# Patient Record
Sex: Male | Born: 1940 | Hispanic: Yes | Marital: Married | Smoking: Never smoker
Health system: Southern US, Community
[De-identification: ages and names within clinical notes are randomized; demographics above are authoritative.]

## PROBLEM LIST (undated history)

## (undated) HISTORY — PX: HERNIA REPAIR: SHX51

## (undated) HISTORY — PX: BREAST LUMPECTOMY: SHX2

---

## 2011-12-24 ENCOUNTER — Ambulatory Visit: Payer: Self-pay | Admitting: Sports Medicine

## 2012-01-25 ENCOUNTER — Ambulatory Visit: Payer: Self-pay | Admitting: Unknown Physician Specialty

## 2013-12-18 ENCOUNTER — Inpatient Hospital Stay: Payer: Self-pay

## 2013-12-18 LAB — CBC WITH DIFFERENTIAL/PLATELET
BASOS PCT: 1 %
Basophil #: 0.1 10*3/uL (ref 0.0–0.1)
Eosinophil #: 0.1 10*3/uL (ref 0.0–0.7)
Eosinophil %: 2.2 %
HCT: 39.1 % — ABNORMAL LOW (ref 40.0–52.0)
HGB: 13.2 g/dL (ref 13.0–18.0)
LYMPHS PCT: 31 %
Lymphocyte #: 2 10*3/uL (ref 1.0–3.6)
MCH: 32.1 pg (ref 26.0–34.0)
MCHC: 33.7 g/dL (ref 32.0–36.0)
MCV: 95 fL (ref 80–100)
Monocyte #: 0.5 x10 3/mm (ref 0.2–1.0)
Monocyte %: 8.1 %
NEUTROS ABS: 3.7 10*3/uL (ref 1.4–6.5)
Neutrophil %: 57.7 %
Platelet: 186 10*3/uL (ref 150–440)
RBC: 4.1 10*6/uL — ABNORMAL LOW (ref 4.40–5.90)
RDW: 14.1 % (ref 11.5–14.5)
WBC: 6.4 10*3/uL (ref 3.8–10.6)

## 2013-12-18 LAB — COMPREHENSIVE METABOLIC PANEL
AST: 23 U/L (ref 15–37)
Albumin: 4 g/dL (ref 3.4–5.0)
Alkaline Phosphatase: 54 U/L
Anion Gap: 4 — ABNORMAL LOW (ref 7–16)
BUN: 17 mg/dL (ref 7–18)
Bilirubin,Total: 0.5 mg/dL (ref 0.2–1.0)
CREATININE: 0.72 mg/dL (ref 0.60–1.30)
Calcium, Total: 8.8 mg/dL (ref 8.5–10.1)
Chloride: 105 mmol/L (ref 98–107)
Co2: 28 mmol/L (ref 21–32)
EGFR (African American): >60
EGFR (Non-African Amer.): >60
Glucose: 98 mg/dL (ref 65–99)
Osmolality: 275 (ref 275–301)
POTASSIUM: 3.8 mmol/L (ref 3.5–5.1)
SGPT (ALT): 24 U/L (ref 12–78)
SODIUM: 137 mmol/L (ref 136–145)
Total Protein: 7 g/dL (ref 6.4–8.2)

## 2013-12-18 LAB — URINALYSIS, COMPLETE
BILIRUBIN, UR: NEGATIVE
BLOOD: NEGATIVE
Bacteria: NONE SEEN
Glucose,UR: NEGATIVE mg/dL (ref 0–75)
Leukocyte Esterase: NEGATIVE
NITRITE: NEGATIVE
Ph: 5 (ref 4.5–8.0)
Protein: NEGATIVE
RBC,UR: 1 /HPF (ref 0–5)
Specific Gravity: 1.023 (ref 1.003–1.030)
Squamous Epithelial: <1
WBC UR: 1 /HPF (ref 0–5)

## 2013-12-18 LAB — PROTIME-INR
INR: 1
Prothrombin Time: 12.6 secs (ref 11.5–14.7)

## 2013-12-18 LAB — TSH: Thyroid Stimulating Horm: 4.7 u[IU]/mL — ABNORMAL HIGH

## 2013-12-19 LAB — BASIC METABOLIC PANEL
ANION GAP: 5 — AB (ref 7–16)
BUN: 12 mg/dL (ref 7–18)
Calcium, Total: 8.7 mg/dL (ref 8.5–10.1)
Chloride: 107 mmol/L (ref 98–107)
Co2: 27 mmol/L (ref 21–32)
Creatinine: 0.78 mg/dL (ref 0.60–1.30)
EGFR (Non-African Amer.): >60
Glucose: 128 mg/dL — ABNORMAL HIGH (ref 65–99)
Osmolality: 279 (ref 275–301)
POTASSIUM: 3.7 mmol/L (ref 3.5–5.1)
Sodium: 139 mmol/L (ref 136–145)

## 2013-12-19 LAB — LIPID PANEL
CHOLESTEROL: 152 mg/dL (ref 0–200)
HDL Cholesterol: 68 mg/dL — ABNORMAL HIGH (ref 40–60)
Ldl Cholesterol, Calc: 68 mg/dL (ref 0–100)
TRIGLYCERIDES: 79 mg/dL (ref 0–200)
VLDL CHOLESTEROL, CALC: 16 mg/dL (ref 5–40)

## 2013-12-20 LAB — URINE CULTURE

## 2014-05-24 ENCOUNTER — Inpatient Hospital Stay: Payer: Self-pay | Admitting: Internal Medicine

## 2014-05-24 LAB — CBC
HCT: 31.1 % — ABNORMAL LOW (ref 40.0–52.0)
HGB: 10.3 g/dL — ABNORMAL LOW (ref 13.0–18.0)
MCH: 32 pg (ref 26.0–34.0)
MCHC: 33 g/dL (ref 32.0–36.0)
MCV: 97 fL (ref 80–100)
Platelet: 198 10*3/uL (ref 150–440)
RBC: 3.21 10*6/uL — ABNORMAL LOW (ref 4.40–5.90)
RDW: 14.5 % (ref 11.5–14.5)
WBC: 9 10*3/uL (ref 3.8–10.6)

## 2014-05-24 LAB — COMPREHENSIVE METABOLIC PANEL
Albumin: 3.1 g/dL — ABNORMAL LOW (ref 3.4–5.0)
Alkaline Phosphatase: 39 U/L — ABNORMAL LOW
Anion Gap: 10 (ref 7–16)
BUN: 26 mg/dL — ABNORMAL HIGH (ref 7–18)
Bilirubin,Total: 0.4 mg/dL (ref 0.2–1.0)
CO2: 24 mmol/L (ref 21–32)
CREATININE: 0.96 mg/dL (ref 0.60–1.30)
Calcium, Total: 8.2 mg/dL — ABNORMAL LOW (ref 8.5–10.1)
Chloride: 105 mmol/L (ref 98–107)
EGFR (African American): >60
GLUCOSE: 246 mg/dL — AB (ref 65–99)
OSMOLALITY: 290 (ref 275–301)
Potassium: 4.1 mmol/L (ref 3.5–5.1)
SGOT(AST): 21 U/L (ref 15–37)
SGPT (ALT): 25 U/L
SODIUM: 139 mmol/L (ref 136–145)
Total Protein: 5.8 g/dL — ABNORMAL LOW (ref 6.4–8.2)

## 2014-05-24 LAB — APTT

## 2014-05-24 LAB — PROTIME-INR
INR: 1.1
Prothrombin Time: 13.9 secs (ref 11.5–14.7)

## 2014-05-24 LAB — HEMATOCRIT: HCT: 27.6 % — ABNORMAL LOW (ref 40.0–52.0)

## 2014-05-24 LAB — HEMOGLOBIN: HGB: 9 g/dL — ABNORMAL LOW (ref 13.0–18.0)

## 2014-05-24 LAB — LIPASE, BLOOD: LIPASE: 139 U/L (ref 73–393)

## 2014-05-25 LAB — BASIC METABOLIC PANEL
Anion Gap: 11 (ref 7–16)
BUN: 14 mg/dL (ref 7–18)
Calcium, Total: 7.5 mg/dL — ABNORMAL LOW (ref 8.5–10.1)
Chloride: 111 mmol/L — ABNORMAL HIGH (ref 98–107)
Co2: 23 mmol/L (ref 21–32)
Creatinine: 0.61 mg/dL (ref 0.60–1.30)
EGFR (African American): >60
EGFR (Non-African Amer.): >60
GLUCOSE: 79 mg/dL (ref 65–99)
Osmolality: 288 (ref 275–301)
Potassium: 3.9 mmol/L (ref 3.5–5.1)
SODIUM: 145 mmol/L (ref 136–145)

## 2014-05-25 LAB — CBC WITH DIFFERENTIAL/PLATELET
BASOS ABS: 0.1 10*3/uL (ref 0.0–0.1)
Basophil %: 2.2 %
Eosinophil #: 0.2 10*3/uL (ref 0.0–0.7)
Eosinophil %: 3.1 %
HCT: 24.8 % — ABNORMAL LOW (ref 40.0–52.0)
HGB: 8.2 g/dL — ABNORMAL LOW (ref 13.0–18.0)
Lymphocyte #: 1.2 10*3/uL (ref 1.0–3.6)
Lymphocyte %: 21 %
MCH: 31.7 pg (ref 26.0–34.0)
MCHC: 33 g/dL (ref 32.0–36.0)
MCV: 96 fL (ref 80–100)
Monocyte #: 0.4 x10 3/mm (ref 0.2–1.0)
Monocyte %: 7.8 %
NEUTROS ABS: 3.6 10*3/uL (ref 1.4–6.5)
NEUTROS PCT: 65.9 %
PLATELETS: 162 10*3/uL (ref 150–440)
RBC: 2.58 10*6/uL — AB (ref 4.40–5.90)
RDW: 14.3 % (ref 11.5–14.5)
WBC: 5.5 10*3/uL (ref 3.8–10.6)

## 2014-05-25 LAB — HEMOGLOBIN A1C: Hemoglobin A1C: 6.4 % — ABNORMAL HIGH (ref 4.2–6.3)

## 2014-05-26 LAB — CBC WITH DIFFERENTIAL/PLATELET
BASOS PCT: 1.1 %
Basophil #: 0.1 10*3/uL (ref 0.0–0.1)
EOS PCT: 2.6 %
Eosinophil #: 0.2 10*3/uL (ref 0.0–0.7)
HCT: 29.7 % — AB (ref 40.0–52.0)
HGB: 9.9 g/dL — ABNORMAL LOW (ref 13.0–18.0)
Lymphocyte #: 1.3 10*3/uL (ref 1.0–3.6)
Lymphocyte %: 21.1 %
MCH: 31.8 pg (ref 26.0–34.0)
MCHC: 33.5 g/dL (ref 32.0–36.0)
MCV: 95 fL (ref 80–100)
Monocyte #: 0.7 x10 3/mm (ref 0.2–1.0)
Monocyte %: 11.2 %
Neutrophil #: 4.1 10*3/uL (ref 1.4–6.5)
Neutrophil %: 64 %
PLATELETS: 165 10*3/uL (ref 150–440)
RBC: 3.12 10*6/uL — ABNORMAL LOW (ref 4.40–5.90)
RDW: 14.1 % (ref 11.5–14.5)
WBC: 6.3 10*3/uL (ref 3.8–10.6)

## 2014-05-26 LAB — BASIC METABOLIC PANEL
Anion Gap: 7 (ref 7–16)
BUN: 5 mg/dL — ABNORMAL LOW (ref 7–18)
CHLORIDE: 109 mmol/L — AB (ref 98–107)
CO2: 26 mmol/L (ref 21–32)
Calcium, Total: 7.9 mg/dL — ABNORMAL LOW (ref 8.5–10.1)
Creatinine: 0.63 mg/dL (ref 0.60–1.30)
EGFR (African American): >60
EGFR (Non-African Amer.): >60
Glucose: 107 mg/dL — ABNORMAL HIGH (ref 65–99)
Osmolality: 281 (ref 275–301)
POTASSIUM: 3.8 mmol/L (ref 3.5–5.1)
Sodium: 142 mmol/L (ref 136–145)

## 2014-05-26 LAB — OCCULT BLOOD X 1 CARD TO LAB, STOOL: OCCULT BLOOD, FECES: POSITIVE

## 2015-01-26 NOTE — Consult Note (Signed)
Pt seen and examined. Please see Dawn Harrison's notes. No further bleeding since 830 this AM. Has hx of diverticulosis and colon polyps. Last colon done in 2011. Likely diverticular bleed, exacerbated by ASA/plavix. Stable. Clear liquid diet ordered. Will check for rebleeding as diet is advanced. Not due to repeat colon until 2016. However, if there is rebleeding, will consider sooner. Bleeding scan only if active bleeding. Thanks.  Electronic Signatures: Verdie Shire (MD)  (Signed on 20-Aug-15 15:47)  Authored  Last Updated: 20-Aug-15 15:47 by Verdie Shire (MD)

## 2015-01-26 NOTE — H&P (Signed)
PATIENT NAME:  Russell Holmes, Russell Holmes MR#:  [id number] DATE OF BIRTH:  23-May-1941  DATE OF ADMISSION:  05/24/2014  PRIMARY CARE PHYSICIAN: Adrian Prows, MD  REFERRING EMERGENCY ROOM PHYSICIAN: Dr. Jacqualine Code  CHIEF COMPLAINT: Blood in stool.  HISTORY OF PRESENT ILLNESS: The patient is a 74 year old male with past medical history of hypertension, recent history of stroke on aspirin and Plavix, diabetes mellitus and hyperlipidemia who is presenting to the ED with the chief complaint of 2 day history of left lower quadrant abdominal pain, which is resolved now, bright red blood mixed with stool x1 yesterday, black tarry stool today morning with no nausea and vomiting. The patient reports no similar complaints in the past. He had a colonoscopy done in the year 2011 and got 2 polyps removed. The patient does not know whether the polyps were benign or malignant. The patient has recent history of stroke approximately 6 weeks ago regarding which he was placed on aspirin and Plavix. In the ED, the patient was made n.p.o. Typing and screening of the blood work done. However, the patient's hemoglobin is stable at 10.3 with hematocrit of 31.1. The ED physician has given him IV fluids and 1 dose of proton pump inhibitor was ordered. He has discussed with the on-call gastroenterologist, Dr. Candace Cruise, who has recommended to admit the patient so that he can see him as soon as possible. During my examination, the patient is resting comfortably. His sister is at bedside.   PAST MEDICAL HISTORY: Recent history of stroke, on aspirin and Plavix, hyperlipidemia, diabetes mellitus, hypertension.   PAST SURGICAL HISTORY: Colonoscopy and polypectomy.   ALLERGIES: No known drug allergies.   PSYCHOSOCIAL HISTORY: Lives at home with wife. Denies any history of smoking. Drinks liquor 2 to 3 drinks two times per week. Denies any illicit drugs.   FAMILY HISTORY: Dad deceased at a young age. Mom has history of COPD.   HOME MEDICATIONS:  Travatan 0.004% one drop to each effected eye once daily, Timolol 0.5% one drop each effected eye 2 times a day, quinapril 40 mg 1 tablet p.o. once daily, metformin 500 mg 2 times a day, lovastatin 40 mg p.o. once daily, Plavix 75 mg once daily, Claritin 10 mg once daily, amlodipine 5 mg once daily.   REVIEW OF SYSTEMS: CONSTITUTIONAL: Denies any fever, fatigue, weakness, weight loss or weight gain.  EYES: Denies blurry vision, double vision, glaucoma.  ENT: Denies epistaxis, discharge, hearing loss.  RESPIRATORY:  Denies cough, COPD or hemoptysis.  CARDIOVASCULAR: No chest pain, palpitations, syncope.  GASTROINTESTINAL: Denies nausea, vomiting. Complaining of diarrhea. Had left upper quadrant abdominal pain for 2 days, which is resolved at this time. Today he has melena. Yesterday he had rectal bleeding with fresh blood mixed with stool. Denies any history of hemorrhoids. GENITOURINARY: No dysuria, hematuria, renal calculi.  ENDOCRINE: Denies polyuria, nocturia. Has history of diabetes mellitus. HEMATOLOGIC AND LYMPHATIC: No anemia, easy bruising, bleeding.  INTEGUMENTARY: No acne, rash, lesions.  MUSCULOSKELETAL: No joint pain in the neck and back. Denies gout. No arthritis.  NEUROLOGIC: Denies vertigo, ataxia. Has recent history of stroke. No residual deficits.  PSYCHIATRIC: Denies any anxiety, insomnia, ADD or OCD.  PHYSICAL EXAMINATION: VITAL SIGNS: Temperature afebrile, pulse 88, respirations 18, blood pressure 117/73, pulse ox 100%.  GENERAL APPEARANCE: Not in acute distress. Moderately built and moderately nourished, thin-looking, not emaciated. HEENT: Normocephalic, atraumatic. Pupils are equally reacting to light and accommodation. No scleral icterus. No conjunctival injection. No sinus tenderness. No postnasal drip. Moist mucous membranes.  NECK: Supple. No JVD. No thyromegaly. Range of motion is intact.  LUNGS: Clear to auscultation bilaterally. No accessory muscle usage. No  anterior chest wall tenderness on palpation.  CARDIAC: S1, S2 normal. Regular rate and rhythm. No murmurs.  ABDOMEN: Soft. Bowel sounds are positive in all 4 quadrants. Nontender, nondistended. No hepatosplenomegaly. No masses felt.  NEUROLOGIC: Awake, alert and oriented x3. Cranial nerves II through XII are grossly intact. Motor and sensory are intact. Reflexes are 2+.  EXTREMITIES: No edema. No cyanosis. No clubbing.  SKIN: Warm to touch. Normal turgor. No rashes. No lesions.  MUSCULOSKELETAL: No joint effusion, tenderness, erythema.  PSYCHIATRIC: Normal mood and affect.  DIAGNOSTIC DATA: WBC 9, hemoglobin 10.3, hematocrit 31.1, platelets 198,000, MCV 97. PT 13.9. INR 1.1 BMP: BUN at 26, glucose at 246. The rest of the BMP is normal. Calcium at 8.2. Lipase 139.   ASSESSMENT AND PLAN: A 74 year old Hispanic male who speaks sufficient English who is presenting to the ED with the chief complaint of left lower quadrant abdominal pain for 2 days which is currently resolved, currently presenting with fresh blood mixed in his stool yesterday and black stool today morning.  1.  Gastrointestinal bleed, both upper and lower. We will keep him n.p.o., admit him to medical floor, keep him n.p.o. Hemoglobin and hematocrit are stable at this time. Provide IV fluids. Provide Protonix 40 mg IV q. 12 hours. Gastroenterology consult is placed to Dr. Candace Cruise and notified by the ED physician. He is aware. We will monitor hemoglobin and hematocrit q. 8 hours. Typing and screening is done. If necessary we will transfuse blood. The patient had a colonoscopy in the year 2011 and was diagnosed with polyps, which were removed. Pathology is unknown to the patient. We will assume benign polyps.  2.  Diabetes mellitus, not insulin-dependent. We will hold his home medication, metformin. Check hemoglobin A1c and cover his hyperglycemia with insulin sliding scale.  3.  Hypertension. Blood pressure is stable at this time. We will hold  home medications. The patient is n.p.o.  4.  Hyperlipidemia. The patient is currently n.p.o. We will resume his statin once the patient's diet is resumed.  5.  Recent history of stroke 4 to 6 weeks ago. We will hold aspirin and Plavix in view of gastrointestinal bleed.  6.  History of alcohol abuse. Counseled the patient to stop drinking.  7.  We will provide gastrointestinal prophylaxis with Protonix IV 2 times a day and deep vein thrombosis prophylaxis with SCDs.   Plan of care was discussed in detail with the patient. He verbalized understanding of the plan. He is FULL code. Wife is the medical power of attorney.   TOTAL TIME SPENT ON THE ADMISSION: 45 minutes.  ____________________________ Nicholes Mango, MD ag:sb D: 05/24/2014 11:21:02 ET T: 05/24/2014 14:52:11 ET JOB#: [id number]  cc: Nicholes Mango, MD, <Dictator> Cheral Marker. Ola Spurr, MD Lupita Dawn. Candace Cruise, MD Nicholes Mango MD ELECTRONICALLY SIGNED 05/24/2014 16:27

## 2015-01-26 NOTE — Consult Note (Signed)
Brief Consult Note: Diagnosis: Lower GI bleed.  Probable diverticular in nature.  History of diverticulosis according to patient as well as history of colonic polyps.  Newly diagnosed with CVA three months ago.  Chronic antiplatelet therapy.  Diabetes mellitus.   Consult note dictated.   Discussed with Attending MD.   Comments: Patient's presentation discussed with Dr. Verdie Shire.  Will proceed with advancing diet from NPO to clear liquid diet.  Continue to monitor for evidence of reoccurrence of bleeding.  If severe may benefit from proceeding with GI blood loss study.  Recommend serial monitoring of hemoglobin.  Transfuse as necessary.  Do recommend continued GI evaluation on outpatient basis if clinically feasible, due to his history of colonic polyps..  Electronic Signatures: Payton Emerald (NP)  (Signed 20-Aug-15 14:37)  Authored: Brief Consult Note   Last Updated: 20-Aug-15 14:37 by Payton Emerald (NP)

## 2015-01-26 NOTE — Consult Note (Signed)
Referring Physician:  Angelena Form :   Primary Care Physician:  Angelena Form : [facility], Internal Medicine, [address] [place], [place] [address], [phone]  Reason for Consult: Admit Date: 18-Dec-2013  Chief Complaint: R sided weakness and slurred speech  Reason for Consult: CVA   History of Present Illness: History of Present Illness:   74 yo RHD M presents to [facility] after abrupt onset of slurred speech and R sided weakness when he awakened.  Pt was not a tPA candidate due to unknown onset time.  Pt has never had any weakness or numbness before or vision changes.  Pt denies difficulty swallowing.  ROS:  General denies complaints    HEENT no complaints    Lungs no complaints    Cardiac no complaints    GI no complaints    GU no complaints    Musculoskeletal no complaints    Extremities no complaints    Skin no complaints    Neuro slurred speech, R sided weakness    Endocrine no complaints    Psych no complaints    Past Medical/Surgical Hx:  Eye Surgery: Has had fluid removed bilat.  hypertension:   low back pain:   Arthritis:   Diabetes Mellitus, Type II (NIDD):   hernia repair:   Home Medications: Medication Instructions Last Modified Date/Time  quinapril 20 mg oral tablet 1 tab(s) orally once a day in am 16-Mar-15 19:16  lovastatin 10 mg oral tablet 1 tab(s) orally once a day in am 16-Mar-15 19:16  metformin 500 mg oral tablet 1 tab(s) orally 2 times a day 16-Mar-15 19:16  Travatan Z 0.004% ophthalmic solution 1 drop(s) to each affected eye once a day (in the evening) 16-Mar-15 19:16  Alphagan P 0.1% ophthalmic solution 1 drop(s) to each affected eye 2 times a day 16-Mar-15 19:16  Bayer Aspirin Regimen 81 mg oral delayed release tablet 1 tab(s) orally once a day 16-Mar-15 19:16  Motrin 200 mg oral tablet 1 tab(s) orally once a day 16-Mar-15 19:16   Allergies:  No Known Allergies:   Social/Family  History: Employment Status: retired  Lives With: spouse  Living Arrangements: house  Social History: no tob, no EtOH, no illicits  Family History: no stroke   Vital Signs: **Vital Signs.:   17-Mar-15 11:04  Vital Signs Type Q 4hr  Temperature Temperature (F) 98.2  Celsius 36.7  Temperature Source oral  Pulse Pulse 84  Respirations Respirations 20  Systolic BP Systolic BP 546  Diastolic BP (mmHg) Diastolic BP (mmHg) 77  Mean BP 97  Pulse Ox % Pulse Ox % 98  Pulse Ox Activity Level  At rest  Oxygen Delivery Room Air/ 21 %   Physical Exam: General: slightly overweight, NAD  HEENT: normocephalic, sclera nonicteric, oropharynx clear  Neck: supple, no JVD, no bruits  Chest: CTA B, no wheezing, good movement  Cardiac: RRR, no murmurs, no edema, 2+ pulses  Extremities: no C/C/E, FROM   Neurologic Exam: Mental Status: alert and oriented x 3, normal language, follows complex commands, moderate dysarthria  Cranial Nerves: PERRLA, EOMI, nl VF, mild R nasolabial flattening, tongue midline, shoulder shrug equal  Motor Exam: 4/5 R, 5-/5 L, nl tone  Deep Tendon Reflexes: 2+/4 B, plantars downgoing B, no Hoffman  Sensory Exam: pinprick, temperature, and vibration intact B  Coordination: FTN and HTS WNL except mild weakness   Lab Results:  Thyroid:  16-Mar-15 16:56   Thyroid Stimulating Hormone  4.70 (0.45-4.50 (International Unit)  -----------------------  Pregnant patients have  different reference  ranges for TSH:  - - - - - - - - - -  Pregnant, first trimetser:  0.36 - 2.50 uIU/mL)  LabObservation:  16-Mar-15 13:59   OBSERVATION Reason for Test  Hepatic:  16-Mar-15 16:56   Bilirubin, Total 0.5  Alkaline Phosphatase 54 (45-117 NOTE: New Reference Range 08/25/13)  SGPT (ALT) 24  SGOT (AST) 23  Total Protein, Serum 7.0  Albumin, Serum 4.0  Routine Micro:  16-Mar-15 13:38   Micro Text Report URINE CULTURE   COMMENT                   COLONIES TOO SMALL TO READ    ANTIBIOTIC                       Specimen Source CLEAN CATCH  Culture Comment COLONIES TOO SMALL TO READ  Result(s) reported on 19 Dec 2013 at 11:59AM.  Routine Chem:  16-Mar-15 13:45   Result Comment labs - verified in error, tests reordered  Result(s) reported on 18 Dec 2013 at 03:16PM.  17-Mar-15 05:21   Glucose, Serum  128  BUN 12  Creatinine (comp) 0.78  Sodium, Serum 139  Potassium, Serum 3.7  Chloride, Serum 107  CO2, Serum 27  Calcium (Total), Serum 8.7  Anion Gap  5  Osmolality (calc) 279  eGFR (African American) >60  eGFR (Non-African American) >60 (eGFR values <50mL/min/1.73 m2 may be an indication of chronic kidney disease (CKD). Calculated eGFR is useful in patients with stable renal function. The eGFR calculation will not be reliable in acutely ill patients when serum creatinine is changing rapidly. It is not useful in  patients on dialysis. The eGFR calculation may not be applicable to patients at the low and high extremes of body sizes, pregnant women, and vegetarians.)  Cholesterol, Serum 152  Triglycerides, Serum 79  HDL (INHOUSE)  68  VLDL Cholesterol Calculated 16  LDL Cholesterol Calculated 68 (Result(s) reported on 19 Dec 2013 at 06:23AM.)  Routine UA:  16-Mar-15 13:38   Color (UA) Yellow  Clarity (UA) Cloudy  Glucose (UA) Negative  Bilirubin (UA) Negative  Ketones (UA) Trace  Specific Gravity (UA) 1.023  Blood (UA) Negative  pH (UA) 5.0  Protein (UA) Negative  Nitrite (UA) Negative  Leukocyte Esterase (UA) Negative (Result(s) reported on 18 Dec 2013 at 03:42PM.)  RBC (UA) 1 /HPF  WBC (UA) 1 /HPF  Bacteria (UA) NONE SEEN  Epithelial Cells (UA) <1 /HPF  Mucous (UA) PRESENT  Calcium Oxalate Crystal (UA) PRESENT (Result(s) reported on 18 Dec 2013 at 03:42PM.)  Routine Coag:  16-Mar-15 16:56   Prothrombin 12.6  INR 1.0 (INR reference interval applies to patients on anticoagulant therapy. A single INR therapeutic range for coumarins is not  optimal for all indications; however, the suggested range for most indications is 2.0 - 3.0. Exceptions to the INR Reference Range may include: Prosthetic heart valves, acute myocardial infarction, prevention of myocardial infarction, and combinations of aspirin and anticoagulant. The need for a higher or lower target INR must be assessed individually. Reference: The Pharmacology and Management of the Vitamin K  antagonists: the seventh ACCP Conference on Antithrombotic and Thrombolytic Therapy. KGMWN.0272 Sept:126 (3suppl): [id number]. A HCT value >55% may artifactually increase the PT.  In one study,  the increase was an average of 25%. Reference:  "Effect on Routine and Special Coagulation Testing Values of Citrate Anticoagulant Adjustment in Patients with High HCT Values." American Journal of  Clinical Pathology 2006;126:400-405.)  Routine Hem:  16-Mar-15 13:45   Bands -  Segmented Neutrophils -  Lymphocytes -  Variant Lymphocytes -  Monocytes -  Eosinophil -  Basophil -  Metamyelocyte -  Myelocyte -  Promyelocyte -  Blast-Like -  Other Cells -  NRBC -  Diff Comment 1 -  Diff Comment 2 -  Diff Comment 3 -  Diff Comment 4 -  Diff Comment 5 -  Diff Comment 6 -  Diff Comment 7 -  Diff Comment 8 -  Diff Comment 9 -  Diff Comment 10 - (Result(s) reported on 18 Dec 2013 at 03:16PM.)    16:56   WBC (CBC) 6.4  RBC (CBC)  4.10  Hemoglobin (CBC) 13.2  Hematocrit (CBC)  39.1  Platelet Count (CBC) 186  MCV 95  MCH 32.1  MCHC 33.7  RDW 14.1  Neutrophil % 57.7  Lymphocyte % 31.0  Monocyte % 8.1  Eosinophil % 2.2  Basophil % 1.0  Neutrophil # 3.7  Lymphocyte # 2.0  Monocyte # 0.5  Eosinophil # 0.1  Basophil # 0.1 (Result(s) reported on 18 Dec 2013 at 05:38PM.)   Radiology Results: [place]:    16-Mar-15 16:09, US Carotid Doppler Bilateral  US Carotid Doppler Bilateral   REASON FOR EXAM:    cva  COMMENTS:       PROCEDURE: [place]  - US CAROTID DOPPLER BILATERAL  - Dec 18 2013  4:09PM     CLINICAL DATA:  CVA, hypertension, syncopal episode, hyperlipidemia,  diabetes    EXAM:  BILATERAL CAROTID DUPLEX ULTRASOUND    TECHNIQUE:  Pearline Cables scale imaging, color Doppler and duplex ultrasound were  performed of bilateral carotid and vertebral arteries in the neck.  COMPARISON:  None.    FINDINGS:  Criteria: Quantification of carotid stenosis is based on velocity  parameters that correlate the residual internal carotid diameter  with NASCET-based stenosis levels, using the diameter of the distal  internal carotid lumen as the denominator for stenosis measurement.    The following velocity measurements were obtained:    RIGHT    ICA:  75/20 cm/sec    CCA:  14/43 cm/sec  SYSTOLIC ICA/CCA RATIO:  0.8    DIASTOLIC ICA/CCA RATIO:  1.54    ECA:  131 cm/sec    LEFT    ICA:  111/21 cm/sec    CCA:  008/67 cm/sec    SYSTOLIC ICA/CCA RATIO:  6.19    DIASTOLIC ICA/CCA RATIO:  1.5  ECA: 137 cm/sec    RIGHT CAROTID ARTERY: There is a minimal amount of intimal wall  thickening and echogenic plaque within the right carotid bulb  (images 12 and 34). There is a minimal amount of eccentric echogenic  plaque involving the origin of the right internal carotid artery  (image 18), not resulting in elevated peak systolic velocities  within the interrogated course of the right internal carotid artery  to suggest a hemodynamically significant stenosis.    RIGHT VERTEBRAL ARTERY:  Antegrade flow    LEFT CAROTID ARTERY: There is a a minimal amount of eccentric mixed  echogenic plaque within the left carotid bulb (image 49). There is a  minimal amount of eccentric echogenic plaque within the mid aspect  of the left internal carotid artery (image 57), not resulting in  elevated peak systolic velocities with the enteric mid course of the  left internal carotid artery to suggest a hemodynamically  significant stenosis.    LEFT VERTEBRAL ARTERY:  Antegrade  flow      IMPRESSION:  Minimal amount of bilateral atherosclerotic plaque, left  subjectively greater to the right, not resulting in a  hemodynamically significant stenosis.      Electronically Signed    By: Sandi Mariscal M.D.    On: 12/18/2013 16:29         Verified By: Aileen Fass, M.D.,  MRI:    17-Mar-15 09:24, MRI Brain Without Contrast  MRI Brain Without Contrast   REASON FOR EXAM:    cva, R weakness, slurred speech  COMMENTS:       PROCEDURE: MR  - MR BRAIN WO CONTRAST  - Dec 19 2013  9:24AM     CLINICAL DATA:  Right-sided weakness and slurred speech. Evaluate  for stroke.    EXAM:  MRI HEAD WITHOUT CONTRAST    TECHNIQUE:  Multiplanar, multiecho pulse sequences of the brain and surrounding  structures were obtained without intravenous contrast.  COMPARISON:  Head CT 12/18/2013    FINDINGS:  There is an acute left basal ganglia infarct in the lateral  lenticulostriate territory. There is no intracranial hemorrhage.  Remote left cerebellar infarcts are noted. Small, scattered foci of  T2 hyperintensity are present in the subcortical and deep cerebral  white matter bilaterally, nonspecific but compatible with mild  chronic small vessel ischemic disease. There is mild to moderate  generalized cerebral atrophy. There is no evidence of mass, midline  shift, or extra-axial fluid collection.    Orbits are unremarkable. Mild mucosal thickening is noted in the  sphenoid sinuses, maxillary sinuses, and left ethmoid air cells.  Mastoid air cells are clear. Major intracranial vascular flow voids  are preserved. C3-4 spondylosis is noted.     IMPRESSION:  1. Acute left basal ganglia infarct.  2. Remote left cerebellar infarcts and mild chronic small vessel  ischemic disease.      Electronically Signed    By: Logan Bores    On: 12/19/2013 10:32         Verified By: Ferol Luz, M.D.,  CT:    16-Mar-15 13:57, CT Head Without Contrast  CT Head Without Contrast    REASON FOR EXAM:    weakness, dysarthrisa  COMMENTS:       PROCEDURE: CT  - CT HEAD WITHOUT CONTRAST  - Dec 18 2013  1:57PM     CLINICAL DATA:  Unsteady gait.    EXAM:  CT HEAD WITHOUT CONTRAST    TECHNIQUE:  Contiguous axial images were obtained from the base of the skull  through the vertex without intravenous contrast.    COMPARISON:  None.  FINDINGS:  Bony calvarium appears intact. Mild diffuse cortical atrophy is  noted. 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 No  mass effect or midline shift is noted. Ventricular size is within  normal limits. There is no evidence of mass lesion, hemorrhage or  acute infarction.     IMPRESSION:  Mild diffuse cortical atrophy. 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 No acute intracranial abnormality seen.      Electronically Signed    By: Sabino Dick M.D.    On: 12/18/2013 14:04     Verified By: Marveen Reeks, M.D.,   Radiology Impression: Radiology Impression: MRI personally reviewed by me and shows a small acute lacunar infarct of L basal ganglia   Impression/Recommendations: Recommendations:   prior notes reviewed by me reviewed by me   Acute L BG lacunar infarct-  etiology is small  vessel disease from uncontrolled HTN most likely,  this is mildly symptomatic and expected to improve over the next few weeks HTN-  uncontrolled start Plavix $RemoveBefo'75mg'LgossBpgiyc$  daily continue statin at current dose d/c ASA needs good BP control with goal < 130/80 as outpatient, < 160/90 inpatient now check Hem A1c continue PT/OT/ST ok to discharge but will need f/u with Neurology in 3 months  Electronic Signatures: Jamison Neighbor (MD)  (Signed 17-Mar-15 17:25)  Authored: REFERRING PHYSICIAN, Primary Care Physician, Consult, History of Present Illness, Review of Systems, PAST MEDICAL/SURGICAL HISTORY, HOME MEDICATIONS, ALLERGIES, Social/Family History, NURSING VITAL SIGNS, Physical Exam-, LAB RESULTS, RADIOLOGY RESULTS,  Recommendations   Last Updated: 17-Mar-15 17:25 by Jamison Neighbor (MD)

## 2015-01-26 NOTE — Discharge Summary (Signed)
PATIENT NAME:  Russell Holmes, Russell Holmes MR#:  [id number] DATE OF BIRTH:  [birth year]  DATE OF ADMISSION:  05/24/2014 DATE OF DISCHARGE:  05/26/2014  ADMITTING DIAGNOSIS: Bright red blood per rectum.   DISCHARGE DIAGNOSES:  1.  Lower gastrointestinal bleed, likely diverticular in nature. The patient's hemoglobin dropped from 10.3 to as low as 8.2 requiring blood transfusion. The patient's previous hemoglobin was normal a few months prior.  2.  Recent history of transient ischemic attack/cerebrovascular accident. The patient needs a revaluation by primary care provider in the next few days to see if he can be restarted on aspirin and Plavix.  3.  Hyperlipidemia.  4.  Diabetes.  5.  Hypertension.   CONSULTANTS: [facility].   PERTINENT LABORATORIES AND EVALUATIONS: Admitting WBC 9, hemoglobin 10.3, hematocrit 31.1, platelet count was 198,000. BMP: BUN was 26, glucose 246, calcium was 8.2, lipase was 139. Most recent hemoglobin on discharge was 9.9; this is after transfusion 1 unit of packed RBCs.   HOSPITAL COURSE: Please refer to H and P done by the admitting physician. The patient is a 74 year old Spanish male who presented with complaint of bright red blood per rectum. The patient was recently started on aspirin and Plavix for a CVA. The patient was admitted, his antiplatelet therapy was held. Hemoglobin was monitored closely. His hemoglobin did drop with admission to as low as 8. The patient was transfused 1 unit of packed RBCs. He was seen in consultation by GI who recommended continuing current therapy. The patient has not had any further bleed over the last 24 hours and is stable for discharge. He will need outpatient GI followup.   DISCHARGE MEDICATIONS: (Dictation Anomaly) 500 one tablet p.o. b.i.d., Travatan 0.004% 1 drop to each affected eye at bedtime, amlodipine 5 daily, lovastatin 40 daily, quinapril 40 daily, Claritin 10 daily, timolol 0.5 one drop to affected eyes b.i.d.,  Protonix 40 daily.   DIET: Low sodium, low fat, low cholesterol, carbohydrate -controlled diet.   ACTIVITY: As tolerated.   DISCHARGE FOLLOWUP: Follow up with Dr. Ola Spurr next Wednesday or Thursday to decide about the anticoagulation. Follow with [place] in 2-4 weeks.   TIME SPENT: Thirty-five minutes on the discharge.    ____________________________ Lafonda Mosses. Posey Pronto, MD shp:TT D: 05/27/2014 08:27:56 ET T: 05/27/2014 14:15:56 ET JOB#: [id number]  cc: Trevious Rampey H. Posey Pronto, MD, <Dictator> Alric Seton MD ELECTRONICALLY SIGNED 06/06/2014 15:36

## 2015-01-26 NOTE — Consult Note (Signed)
Chief Complaint:  Subjective/Chief Complaint No further bleeding since yest AM. Hgb dropped. Received 1 unit of blood. No abd pain.   VITAL SIGNS/ANCILLARY NOTES: **Vital Signs.:   21-Aug-15 08:24  Vital Signs Type Blood Transfusion Complete  Temperature Temperature (F) 99  Celsius 37.2  Temperature Source oral  Pulse Pulse 83  Respirations Respirations 19  Systolic BP Systolic BP 761  Diastolic BP (mmHg) Diastolic BP (mmHg) 85  Mean BP 108  Pulse Ox % Pulse Ox % 100  Oxygen Delivery Room Air/ 21 %   Brief Assessment:  GEN no acute distress   Cardiac Regular   Respiratory clear BS   Gastrointestinal Normal   Lab Results: Routine Chem:  21-Aug-15 01:34   Glucose, Serum 79  BUN 14  Creatinine (comp) 0.61  Sodium, Serum 145  Potassium, Serum 3.9  Chloride, Serum  111  CO2, Serum 23  Calcium (Total), Serum  7.5  Anion Gap 11  Osmolality (calc) 288  eGFR (African American) >60  eGFR (Non-African American) >60 (eGFR values <72mL/min/1.73 m2 may be an indication of chronic kidney disease (CKD). Calculated eGFR is useful in patients with stable renal function. The eGFR calculation will not be reliable in acutely ill patients when serum creatinine is changing rapidly. It is not useful in  patients on dialysis. The eGFR calculation may not be applicable to patients at the low and high extremes of body sizes, pregnant women, and vegetarians.)  Hemoglobin A1c ([facility])  6.4 (The American Diabetes Association recommends that a primary goal of therapy should be <7% and that physicians should reevaluate the treatment regimen in patients with HbA1c values consistently >8%.)  Routine Hem:  21-Aug-15 01:34   WBC (CBC) 5.5  RBC (CBC)  2.58  Hemoglobin (CBC)  8.2  Hematocrit (CBC)  24.8  Platelet Count (CBC) 162  MCV 96  MCH 31.7  MCHC 33.0  RDW 14.3  Neutrophil % 65.9  Lymphocyte % 21.0  Monocyte % 7.8  Eosinophil % 3.1  Basophil % 2.2  Neutrophil # 3.6  Lymphocyte #  1.2  Monocyte # 0.4  Eosinophil # 0.2  Basophil # 0.1 (Result(s) reported on 25 May 2014 at 01:59AM.)   Assessment/Plan:  Assessment/Plan:  Assessment Diverticular bleeding. No bleeding.   Plan Start clears and advance as tolerated. Watch for any rebleeding. Will check on Monday if patient stilll here. See our recommendations from yest. Call GI on call this weekend if patient rebleeds actively. thanks.   Electronic Signatures: Verdie Shire (MD)  (Signed 21-Aug-15 11:55)  Authored: Chief Complaint, VITAL SIGNS/ANCILLARY NOTES, Brief Assessment, Lab Results, Assessment/Plan   Last Updated: 21-Aug-15 11:55 by Verdie Shire (MD)

## 2015-01-26 NOTE — Consult Note (Signed)
PATIENT NAME:  IDO, WOLLMAN MR#:  [id number] DATE OF BIRTH:  21-May-1941  DATE OF CONSULTATION:  05/24/2014  REFERRING PHYSICIAN:   CONSULTING PHYSICIAN:  Payton Emerald, NP  ATTENDING PHYSICIAN: Dr. Margaretmary Eddy.  REASON FOR CONSULTATION: Lower gastrointestinal bleed.   PRIMARY CARE DOCTOR: Dr. Adrian Prows.   HISTORY OF PRESENT ILLNESS: The patient is a 74 year old Hispanic gentleman who presented to [facility] Emergency Room for the concern of lower GI bleed. Acute onset yesterday evening at approximately 7:00. States bright red blood, mostly liquid with some small amount of fecal matter. Continued throughout the night. He had 7 occurrences, most recent occurrence of bleeding though was this morning at 8:30. States that the fecal matter was darker black in color. He has had no recent as far as the last couple of days with associated abdominal pain. He states he did have some mild discomfort to his lower abdomen Sunday and Monday, which resolved on its own. No nausea. No vomiting. Appetite has been good. Denies any chest pain, shortness of breath, dizziness, lightheadedness or syncopal or near syncopal episodes. He did suffer from a CVA though 3 months ago and he is on chronic antiplatelet therapy, taking aspirin as well as Plavix every day.   PAST MEDICAL HISTORY: Diabetes mellitus. Hypertension. Hyperlipidemia. CVA.   PAST SURGICAL HISTORY: Inguinal hernia.   FAMILY HISTORY: No family history of colon cancer or colonic polyps.   SOCIAL HISTORY: No tobacco use. History of alcohol 3-4 shots a night up until the past couple of months. States that he has avoided since that time due to his wife being ill. She has just received a lung transplant.   ALLERGIES: No known drug allergies.   HOME MEDICATIONS: Norvasc 5 mg daily. Claritin 10 mg a day. Plavix 75 mg daily. Lovastatin 40 mg once a day. Metformin 500 mg 1 tablet twice a day. Quinapril 40 mg once a day. Timolol ophthalmic 0.5% ophthalmic  solution 1 drop affected eyes twice a day. Travatan Z 0.004% ophthalmic solution 1 drop each eye daily.   ALLERGIES: None.  REVIEW OF SYSTEMS: CONSTITUTIONAL: Denies any extreme fatigue, weakness, weight loss, weight gain.  EYES: No blurred vision, unusual as he does wear glasses. No hearing loss, blurred vision, double vision.  PULMONARY: Denies any shortness of breath, wheezing, coughing.  CARDIOVASCULAR: No heart palpitations, chest pain.  GASTROINTESTINAL: See HPI.  GENITOURINARY: No dysuria or hematuria.  ENDOCRINE: No temperature disturbances such as heat or cold intolerance.  MUSCULOSKELETAL: Significant for arthralgias. No myalgias.  NEUROLOGIC: Recent cerebrovascular accident 3 months month ago.  PSYCHIATRIC: No depression. No anxiety.   Other than what is stated, review of systems is unremarkable.   PHYSICAL EXAMINATION: VITAL SIGNS: Temperature is 98, pulse is 75, respirations are 12. Blood pressure 137/83 with a pulse oximetry of 99% on room air.  GENERAL: Well-developed, well-nourished, 74 year old gentleman, in no acute distress noted. Pleasant.  HEENT: Normocephalic, atraumatic. Pupils equal, reactive to light. Conjunctivae clear. Sclerae anicteric.  NECK: Supple. Trachea midline. No lymphadenopathy or thyromegaly.  PULMONARY: Symmetric rise and fall of chest. Clear to auscultation throughout.  CARDIOVASCULAR: Regular rate and rhythm, S1, S2. No murmurs, no gallops.  ABDOMEN: Soft, nondistended. Bowel sounds in 4 quadrants. No bruits. No masses.  RECTAL: Deferred.  MUSCULOSKELETAL: Moving all 4 extremities. No contractures. No clubbing.  EXTREMITIES: No edema.  PSYCHIATRIC: Alert x4. Memory grossly intact. Appropriate affect and mood.  NEUROLOGICAL: No gross neurological deficits.   LABORATORY DATA: Chemistry panel, glucose is 246, BUN is  26. Calcium was low at 8.2; otherwise, within normal limits. Hepatic panel: Total protein 5.8 albumin is 3.1. Alkaline phosphatase is  low at 39; otherwise, within normal limits. CBC: Hemoglobin is 10.3 with hematocrit 31.1. RDW is 3.21; otherwise, within normal limits. Antibody screen is negative. ABO group is A positive. PT is 13.1 with an INR of 1.0, PTT is less than 23.0.   IMPRESSION: Lower gastrointestinal bleed, probable diverticular in nature. The patient does state he has a history of diverticulosis, though he does have a history of colonic polyps. States that his most recent colonoscopy was in 2011, which was done while he was residing in [place]. Colonic polyps were resected at that time. That was a second colonoscopy which he has had a polyp, resected and retrieved both times. Anemia. Recent CVA on chronic anticoagulant therapy.   PLAN: The patient's presentation was discussed with Dr. Verdie Shire. At this time, we will advance the patient's diet from n.p.o. to clear liquid diet. I will continue to monitor patient. Do recommend serial monitoring of hemoglobin and hematocrit. Transfuse as necessary. I do feel the patient does warrant continued GI on an outpatient basis. Do think that a diagnostic colonoscopy would be warranted at beneficial given his history of colonic polyps, if clinically feasible to be done outpatient.   These services provided by Payton Emerald, MS, APRN, [facility], FNP , under collaborative agreement with Dr. Verdie Shire. .   ____________________________ Payton Emerald, NP dsh:ls D: 05/24/2014 14:34:24 ET T: 05/24/2014 16:35:52 ET JOB#: [id number]  cc: Payton Emerald, NP, <Dictator> Payton Emerald MD ELECTRONICALLY SIGNED 05/29/2014 8:02

## 2015-01-26 NOTE — H&P (Signed)
PATIENT NAME:  Russell Holmes, Russell Holmes MR#:  [id number] DATE OF BIRTH:  [birth year]  DATE OF ADMISSION:  12/18/2013  ADMISSION DIAGNOSIS:  Weakness and slurred speech.   HISTORY OF PRESENT ILLNESS: This is a very pleasant 74 year old gentleman who I follow as an outpatient for his diabetes, hypertension and hyperlipidemia, all of which are at goal. When last seen January 30, he had some concerns about his memory and also some depression. He was started on Zoloft. He was brought in today by his wife to the clinic with 1 day history of some foot dragging, difficulty finding words and some slurred speech. She says this began Sunday morning. She noticed him dragging his feet.  He minimized it and it seemed to improve later in the day. She noticed he was having trouble finding words and speaking. He also was reporting some right-sided weakness. Of note, he has had no fevers, chills, night sweats or cough. He has not had any urinary symptoms or recent illnesses. He was in his usual state of health until yesterday.   PAST MEDICAL HISTORY:  1.  Diabetes, well controlled.  2.  Hypertension, well controlled.  3.  Hyperlipidemia, well controlled.   SOCIAL HISTORY: The patient is married. He drinks 2 to 3 drinks daily. 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 He does not smoke.   FAMILY HISTORY: Positive for high blood pressure and asthma.   REVIEW OF SYSTEMS:  Difficult to obtain from the patient due to his difficulty with word finding.   PHYSICAL EXAMINATION:  VITAL SIGNS: Blood pressure in clinic was 170/90 with a pulse of 80. The patient was sent to the direct admit and on the floor on arrival his blood pressure was 225/94 with a pulse of 75, temperature 98, respirations 19 and saturation 99% on room air.  GENERAL: He is pleasant and interactive. He is able to talk, but he is having slurred speech and difficulty with word finding. He is oriented to where he is and the year.  HEENT:  His pupils are  equal, round and reactive to light and accommodation. Extraocular movements are intact. His sclerae are anicteric. His oropharynx is clear. His cranial nerves 2 through 12 are grossly intact.  NECK: Supple.  HEART: Regular.  LUNGS: Clear.  ABDOMEN: Soft, nontender and nondistended. No hepatosplenomegaly.  EXTREMITIES: No clubbing, cyanosis or edema.  NEUROLOGIC: He is alert and interactive. Cranial nerves are intact. Left upper and lower extremity 5/5, right upper extremity 4/5 at the biceps and triceps and shoulders and right lower extremity 5/5. Reflexes 1+ bilaterally.   MEDICATIONS:  1.  Metformin 500 twice a day.  2.  Lovastatin 40 milligrams once a day.  3.  Quinapril 40 milligrams once a day.  4.  Travatan eye drops 0.004% 1 drop both eyes. 5.  Alphagan 1 drop both eyes.  6.  Aspirin 81 milligrams once a day.   ALLERGIES: No known drug allergies.   LABORATORY AND DIAGNOSTICS: CT of the head is negative for any acute infarcts. This is consistent with some chronic small vessel ischemic disease. Urinalysis is negative for infection. White blood count is 6.4, hemoglobin 13.2, platelets 186,000, INR 1.0.  Comprehensive metabolic panel is normal. TSH slightly elevated at 4.7.   IMPRESSION: A 74 year old gentleman with history of hypertension, hyperlipidemia and diabetes admitted with findings concerning for an acute CVA. He mainly has word finding difficulty and some slurred speech. He also is somewhat weak in the right upper extremity.  Labs  are normal. CT of the head initially was negative. His blood pressure was also markedly elevated. Of note, when he was last seen in clinic 1 month ago his blood pressure was well controlled at 128/82.   PLAN:  1.  Will admit the patient to telemetry.  2.  Check echo and carotids.  3.  Put the patient in for MRI in the morning.  4.  Will start the patient on his outpatient blood pressure meds allow permissive  hypertension given the likely acute CVA.   5.  For his diabetes we will continue metformin. His most recent A1c was 7.2.  6.  Hyperlipidemia. Continue him on his lovastatin. His LDL was 104 and his HDL was 79 when last checked in January.  7.  Deep vein thrombosis prophylaxis.  We placed the patient on subcutaneous heparin.  8.  We will consult physical therapy and speech therapy.   TIME SPENT: This admission took 40 minutes.     ____________________________ Cheral Marker. Ola Spurr, MD dpf:mk D: 12/18/2013 20:35:42 ET T: 12/18/2013 20:49:53 ET JOB#: [id number]  cc: Cheral Marker. Ola Spurr, MD, <Dictator> Terek Bee Ola Spurr MD ELECTRONICALLY SIGNED 12/29/2013 13:35

## 2015-01-26 NOTE — Discharge Summary (Signed)
PATIENT NAME:  Russell Holmes, Russell Holmes MR#:  [id number] DATE OF BIRTH:  1941-08-31  DATE OF ADMISSION:  12/18/2013 DATE OF DISCHARGE:  12/20/2013  DISCHARGE DIAGNOSES: 1.  Acute left basilar ganglia cerebrovascular accident.  2.  Malignant hypertension.  3.  Diabetes.  4.  Hyperlipidemia.   HISTORY OF PRESENT ILLNESS: Please see initial history and physical for details. Briefly, this is a 74 year old with well-controlled diabetes, hypertension, hyperlipidemia, admitted from clinic with symptoms of 1 day of right-sided weakness, as well as some slurred speech and difficulty with word finding. He was admitted, had a CT scan of the head which was negative for acute infarct or bleed. He was started on aspirin. His blood pressure was quite high on admission, but he was allowed to have permissive hypertension. Norvasc was added to his quinapril. The patient had carotid Dopplers, which were negative for significant stenosis, as well as echocardiogram, which was relatively normal. He had an MRI that did reveal the acute infarct.   The patient was seen by neurology. It was recommended that he change from aspirin to Plavix. He was started on Plavix 75 mg once a day and his aspirin was held. For his blood pressure, Norvasc was added to his quinapril. Blood pressure remained somewhat elevated, but again, was being allowed to run high given the acute CVA. Lipid panel did reveal his LDL was at goal.   DISCHARGE MEDICATIONS: Please see [facility] discharge instructions. The patient will be stopping aspirin and starting Plavix. Norvasc was also added to his regimen for his blood pressure.   DISCHARGE DIET: Low salt, regular consistency.  DISCHARGE FOLLOWUP: The patient will follow up with Dr. Ola Spurr within 1 to 2 weeks. The patient was instructed if he gets any further symptoms related to the stroke, or if he gets any bleeding in his stool or elsewhere, he should call immediately.   This discharge took 40 minutes.    ____________________________ Cheral Marker. Ola Spurr, MD dpf:dmm D: 12/20/2013 08:27:04 ET T: 12/20/2013 12:05:53 ET JOB#: [id number]  cc: Cheral Marker. Ola Spurr, MD, <Dictator> DAVID Ola Spurr MD ELECTRONICALLY SIGNED 01/06/2014 13:25

## 2015-01-27 NOTE — Op Note (Signed)
PATIENT NAME:  Russell Holmes, Russell Holmes MR#:  [id number] DATE OF BIRTH:  Feb 06, 1941  DATE OF PROCEDURE:  01/25/2012  PREOPERATIVE DIAGNOSIS: Rotator cuff tear, right shoulder with secondary impingement.   POSTOPERATIVE DIAGNOSIS: Rotator cuff tear, right shoulder with secondary impingement.   PROCEDURE: Arthroscopic subacromial decompression, right shoulder plus mini incision rotator cuff repair.   SURGEON: Kathrene Alu., MD  ANESTHESIA: General.   HISTORY: The patient had a long history of discomfort and weakness in his right shoulder. Plain films did not reveal any significant abnormality but MRI was consistent with a large rotator cuff tear that was retracted. Patient was ultimately brought in for surgery due to his persistent symptoms.   DESCRIPTION OF PROCEDURE: Patient taken to the Operating Room where satisfactory general anesthesia was achieved. The patient was turned to the lateral decubitus position with the right shoulder up. The right shoulder was then prepped and draped in the usual fashion for a shoulder procedure. The shoulder was suspended with the Acufex shoulder suspension device. The shoulder was maintained in about 25 degrees of abduction. We used 10 pounds of traction initially and then reduce his to 5 later on in the procedure.   Patient incidentally was given 2 grams Kefzol IV prior to start of the procedure.   The scope was introduced into the glenohumeral joint through a posterior portal. The joint was distended with lactated Ringer's. We used the Mitek fluid pump to facilitate joint distention.   Inspection of the glenohumeral joint revealed that the biceps tendon was intact. The labrum appeared to be intact. Articular surfaces were smooth. No loose bodies were visualized.   The patient was noted to have a large rotator cuff tear that significantly retracted.   The scope was then switched to the subacromial space. Patient was noted to have fraying on the  undersurface of the acromion. He had some prominence to the inferior aspect of the anterior portion of his acromion. He did have a large cuff tear that was significantly retracted. I established a lateral portal. I placed a grasper on the residual cuff tissue. I could pull it out to just about the junction of the greater tuberosity and articular surface of the humeral head.   I went ahead and used ArthroCare wand to release the inferior aspect of the cuff from the glenoid and then I also used it to help release some of the scarred down superior portion of the cuff. I then used the angled ArthroCare wand to remove soft tissue from the undersurface of the acromion and then I inserted a synovial resector to further debride the inferior surface of the acromion.   The scope was switched to the lateral portal and acromionizer bur was brought in through a posterior portal. I went ahead and performed a subacromial decompression. I did release the acromial attachment of the coracoacromial ligament at this time.   I then switched the scope back to the posterior portal and brought a large round bur in through the lateral portal and lightly decorticated the greater tuberosity in the area of intended reattachment of the cuff. I did remove some of the articular surface near the edge of the tuberosity in order to more medialize the attachment of the cuff.   I then removed this scope from the joint. I enlarged the lateral puncture to the edge of the acromion. I dissected down to the deltoid which was divided in line with its fibers. A portion of the inferior attachment and posterior attachment of the  deltoid were reflected off the acromion. Gelpi retractors were inserted. I then further mobilized the cuff superiorly with an elevator and used a Liberator blade inferior to further free up the cuff from the glenoid.   I was able to reattach the cuff to the medial edge of the greater tuberosity using two speed screws. One was  5.5 mm in diameter and one was 6.5 mm in diameter. I previously had placed two inverted horizontal mattress sutures in the cuff with an ArthroCare FirstPass suture inserter. These horizontal mattress sutures were then attached to the speed screws and tightened down bringing the cuff to the medial edge of the greater tuberosity. I then reinforced this repair with a Conexa dermal xenograft. It was attached to the cuff with two nonabsorbable #2 sutures that were placed into the cuff in horizontal mattress fashion and then it was attached to the greater tuberosity with two Spartan anchors that had #2 nonabsorbable sutures attached to them. The sutures were passed through the cuff in horizontal mattress fashion bringing the cuff down to the greater tuberosity. The overall cuff reconstruction seemed to be fairly secure.   The wound was irrigated with GU irrigant. The deltoid was reattached to the acromion through drill holes with #1 Ethibond sutures. The deltoid interval was closed with 0 Vicryl and subcutaneous with 2-0 Vicryl and skin with skin staples. The posterior puncture was closed with 3-0 nylon in vertical mattress fashion. I injected posterior puncture wound and incision with several mL of 0.5% Marcaine without epinephrine.   Betadine was applied to the wounds. Four TENS pads were placed about the shoulder wounds and then a sterile dressing applied. A shoulder immobilizer was applied and the patient was then turned supine and awakened. He was transferred to a stretcher bed. He was taken to recovery room in satisfactory condition. Blood loss was negligible.   SUMMARY OF IMPLANTS USED: Two speed screws, one 6.5 mm in diameter and the other 5.5 mm in diameter along with two Spartan suture anchors.   ____________________________ Kathrene Alu., MD hbk:cms D: 01/26/2012 00:08:17 ET T: 01/26/2012 09:43:24 ET JOB#: [id number]  cc: Kathrene Alu., MD, <Dictator> Vilinda Flake, Brooke Bonito  MD ELECTRONICALLY SIGNED 02/06/2012 14:03

## 2015-05-07 ENCOUNTER — Other Ambulatory Visit: Payer: Self-pay | Admitting: Physician Assistant

## 2015-05-07 ENCOUNTER — Ambulatory Visit
Admission: RE | Admit: 2015-05-07 | Discharge: 2015-05-07 | Disposition: A | Discharge status: Home / Self Care | Payer: Medicare Other | Source: Ambulatory Visit | Attending: Physician Assistant | Admitting: Physician Assistant

## 2015-05-07 DIAGNOSIS — G319 Degenerative disease of nervous system, unspecified: Secondary | ICD-10-CM | POA: Diagnosis not present

## 2015-05-07 DIAGNOSIS — I679 Cerebrovascular disease, unspecified: Secondary | ICD-10-CM | POA: Diagnosis not present

## 2015-05-07 DIAGNOSIS — S20212A Contusion of left front wall of thorax, initial encounter: Secondary | ICD-10-CM

## 2015-05-07 DIAGNOSIS — W19XXXA Unspecified fall, initial encounter: Secondary | ICD-10-CM | POA: Insufficient documentation

## 2015-05-07 DIAGNOSIS — S0093XA Contusion of unspecified part of head, initial encounter: Secondary | ICD-10-CM

## 2015-05-07 DIAGNOSIS — Z7901 Long term (current) use of anticoagulants: Secondary | ICD-10-CM | POA: Diagnosis not present

## 2015-05-07 DIAGNOSIS — S0990XA Unspecified injury of head, initial encounter: Secondary | ICD-10-CM | POA: Diagnosis not present

## 2015-08-26 IMAGING — CT CT HEAD WITHOUT CONTRAST
1 series · 16 of 30 positions shown, 20 images · non-contrast
Comparison: None.

CLINICAL DATA: Unsteady gait.

EXAM:
CT HEAD WITHOUT CONTRAST
TECHNIQUE: Contiguous axial images were obtained from the base of the skull
through the vertex without intravenous contrast.

[Series 2: head wo · axial · 0.45mm/px · z∈[-128,-2]mm · 16 of 32 slices shown, 20 images]
[im 2/32  brain]
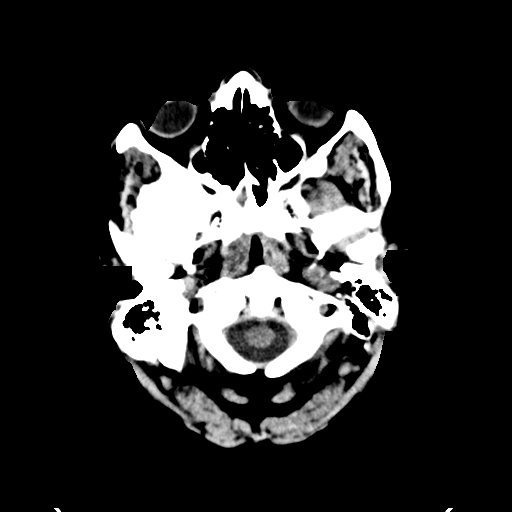
[im 2/32  bone]
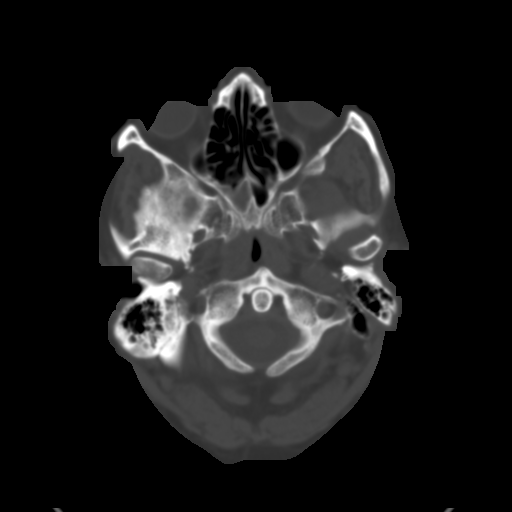
[im 4/32  brain]
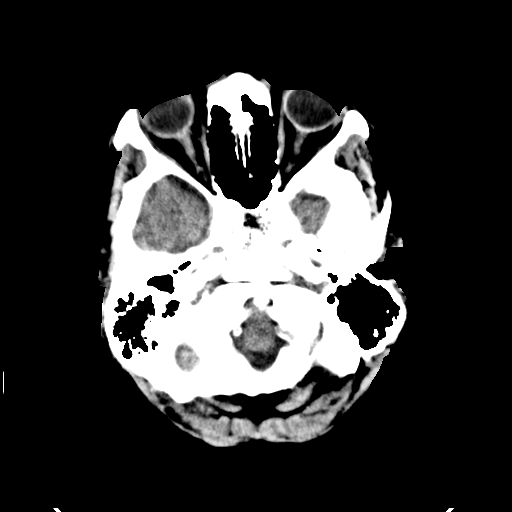
[im 6/32  brain]
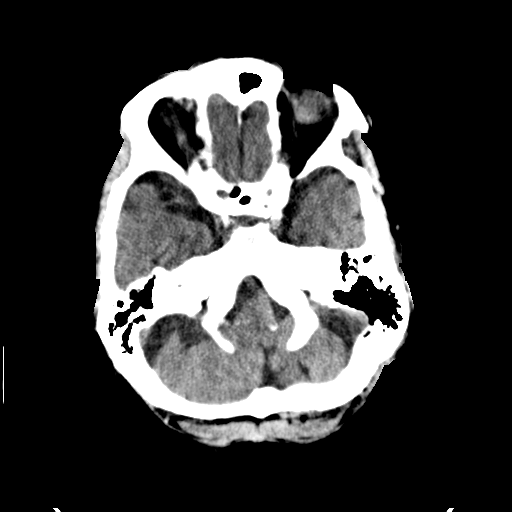
[im 8/32  brain]
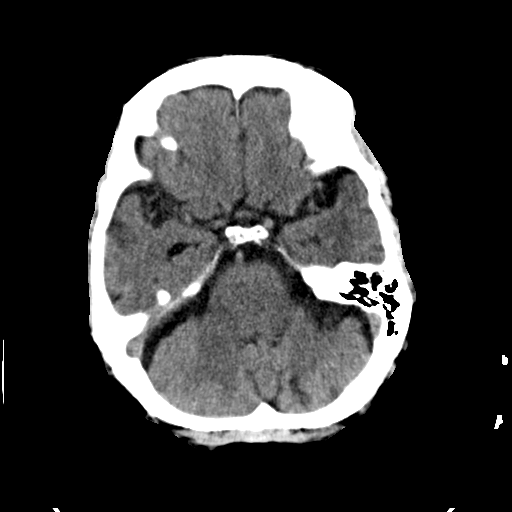
[im 9/32  brain]
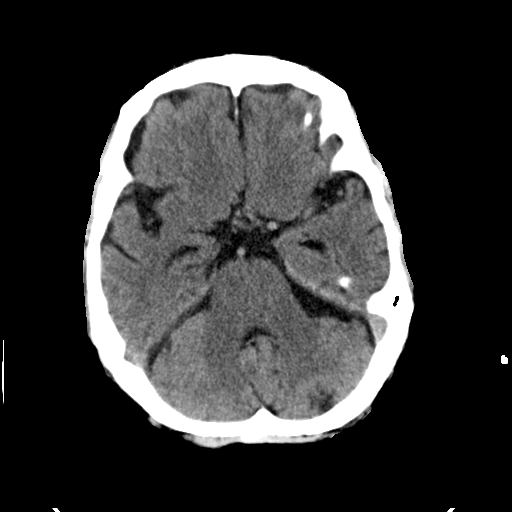
[im 9/32  bone]
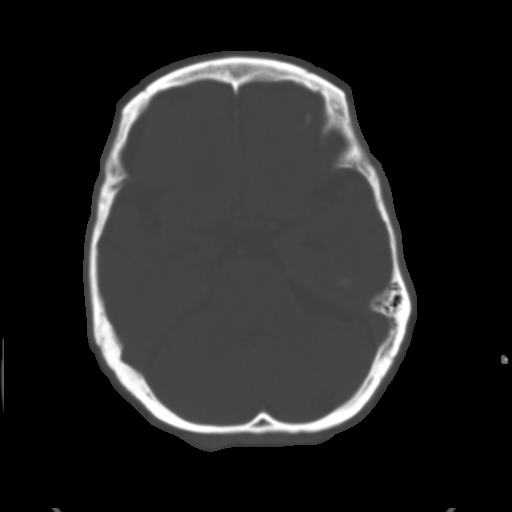
[im 11/32  brain]
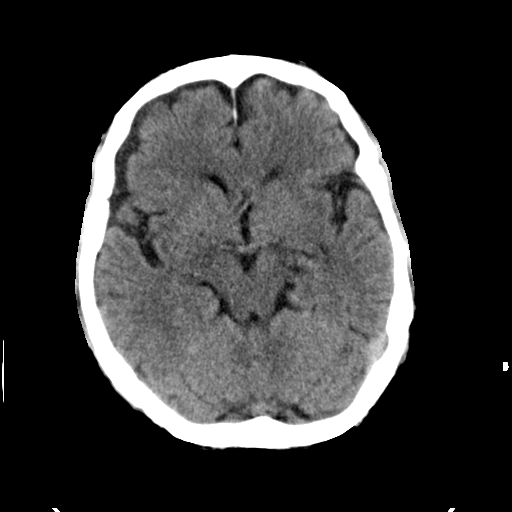
[im 13/32  brain]
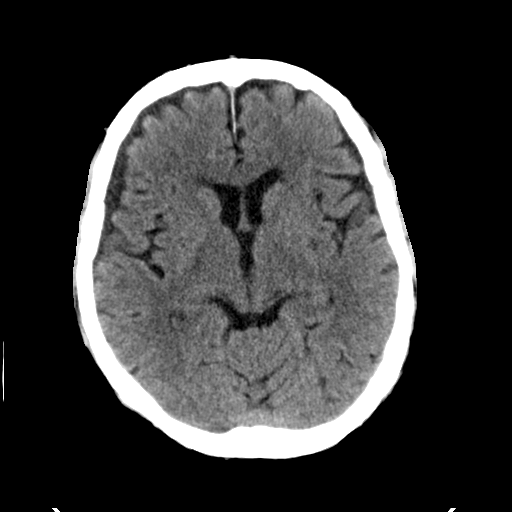
[im 15/32  brain]
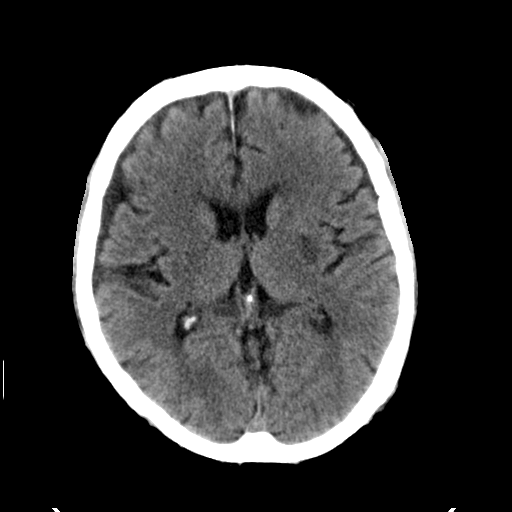
[im 17/32  brain]
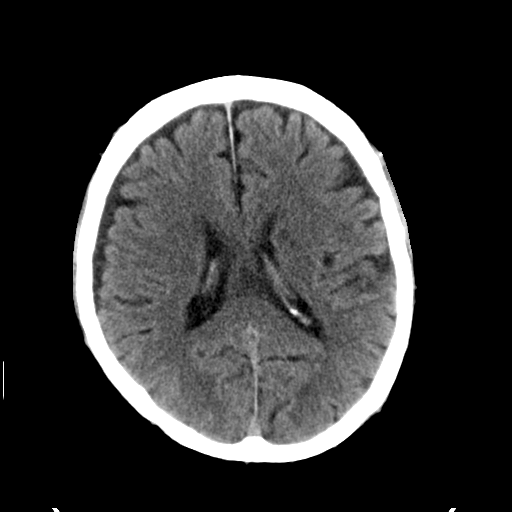
[im 17/32  bone]
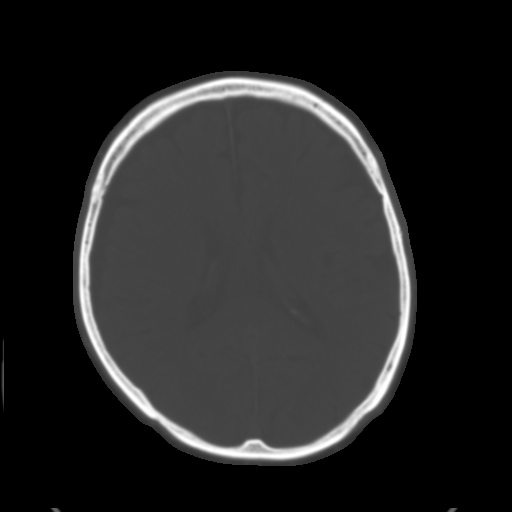
[im 19/32  brain]
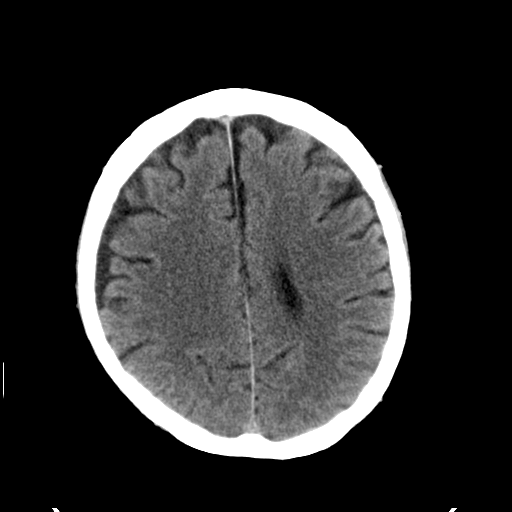
[im 21/32  brain]
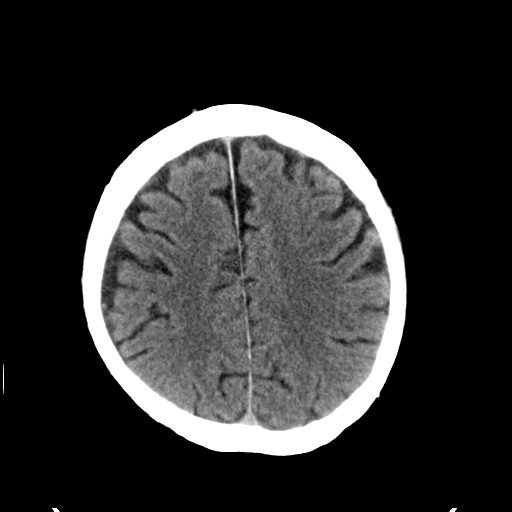
[im 23/32  brain]
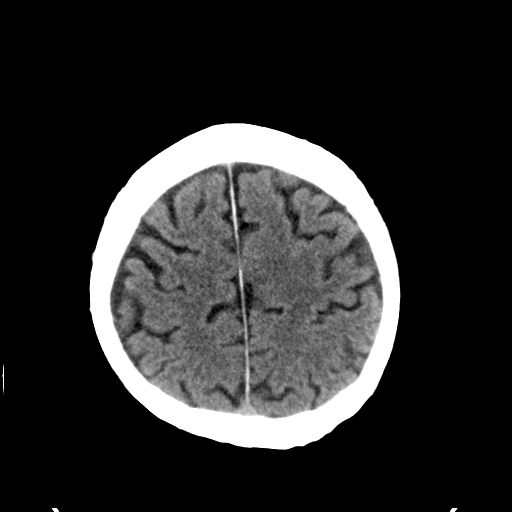
[im 24/32  brain]
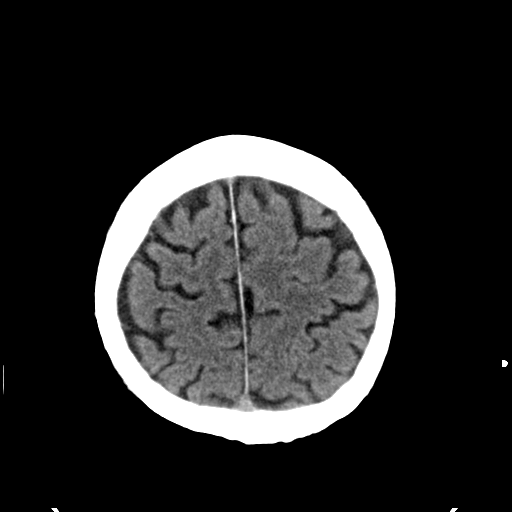
[im 24/32  bone]
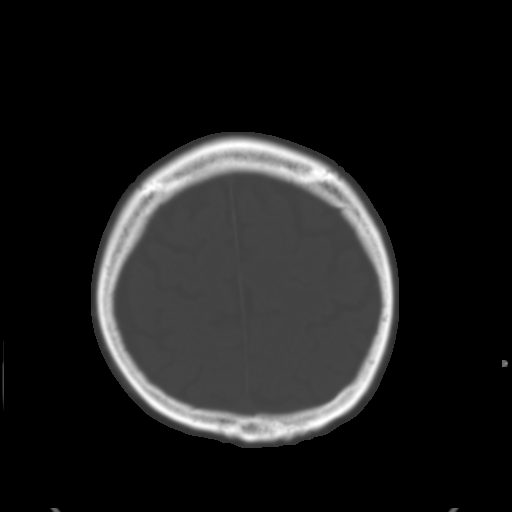
[im 26/32  brain]
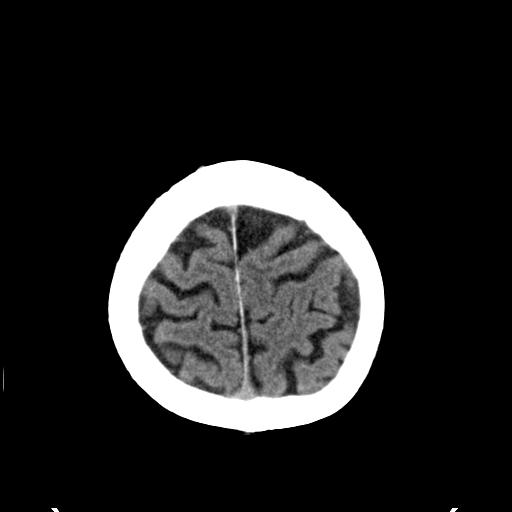
[im 28/32  brain]
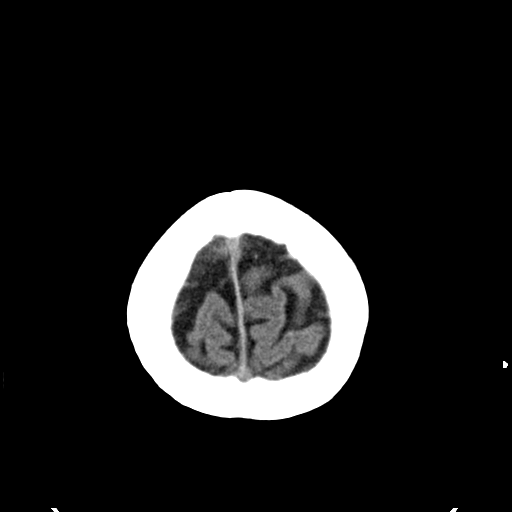
[im 30/32  brain]
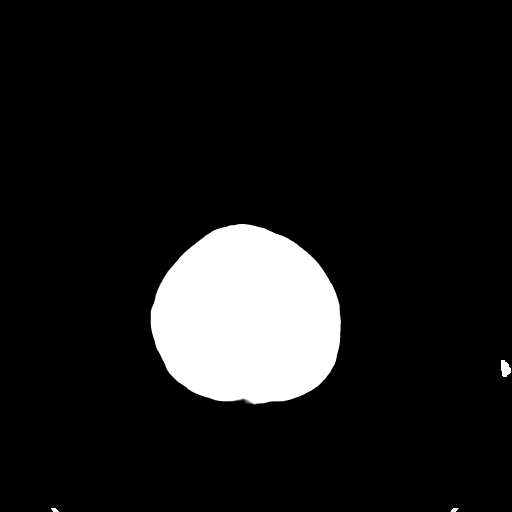

[16 of 30 positions shown; findings below may reference images not displayed]

FINDINGS: Bony calvarium appears intact. Mild diffuse cortical atrophy is
noted. Old lacunar infarction is noted in left basal ganglia. No
mass effect or midline shift is noted. Ventricular size is within
normal limits. There is no evidence of mass lesion, hemorrhage or
acute infarction.
IMPRESSION: Mild diffuse cortical atrophy. Old lacunar infarction left basal
ganglia. No acute intracranial abnormality seen.

## 2016-02-17 ENCOUNTER — Other Ambulatory Visit: Payer: Self-pay | Admitting: Physician Assistant

## 2016-02-17 ENCOUNTER — Other Ambulatory Visit (HOSPITAL_COMMUNITY): Payer: Self-pay | Admitting: Physician Assistant

## 2016-02-17 ENCOUNTER — Ambulatory Visit
Admission: RE | Admit: 2016-02-17 | Discharge: 2016-02-17 | Disposition: A | Discharge status: Home / Self Care | Payer: Medicare Other | Source: Ambulatory Visit | Attending: Physician Assistant | Admitting: Physician Assistant

## 2016-02-17 DIAGNOSIS — M79661 Pain in right lower leg: Secondary | ICD-10-CM

## 2016-02-17 DIAGNOSIS — I82811 Embolism and thrombosis of superficial veins of right lower extremities: Secondary | ICD-10-CM | POA: Insufficient documentation

## 2016-02-17 DIAGNOSIS — M7989 Other specified soft tissue disorders: Secondary | ICD-10-CM | POA: Diagnosis not present

## 2017-10-01 ENCOUNTER — Other Ambulatory Visit: Payer: Self-pay | Admitting: Physician Assistant

## 2017-10-01 DIAGNOSIS — R1310 Dysphagia, unspecified: Secondary | ICD-10-CM

## 2017-10-27 ENCOUNTER — Ambulatory Visit
Admission: RE | Admit: 2017-10-27 | Discharge: 2017-10-27 | Disposition: A | Discharge status: Home / Self Care | Payer: Medicare Other | Source: Ambulatory Visit | Attending: Physician Assistant | Admitting: Physician Assistant

## 2017-10-27 DIAGNOSIS — K219 Gastro-esophageal reflux disease without esophagitis: Secondary | ICD-10-CM | POA: Diagnosis not present

## 2017-10-27 DIAGNOSIS — R1310 Dysphagia, unspecified: Secondary | ICD-10-CM | POA: Insufficient documentation

## 2018-03-21 ENCOUNTER — Ambulatory Visit: Payer: Self-pay | Admitting: Urology

## 2018-09-21 ENCOUNTER — Ambulatory Visit: Payer: Self-pay | Admitting: Urology

## 2018-10-11 ENCOUNTER — Ambulatory Visit: Payer: Medicare Other | Admitting: Urology

## 2018-10-11 ENCOUNTER — Encounter: Payer: Self-pay | Admitting: Urology

## 2018-10-11 VITALS — BP 194/89 | HR 98 | Ht 64.0 in | Wt 146.8 lb

## 2018-10-11 DIAGNOSIS — N401 Enlarged prostate with lower urinary tract symptoms: Secondary | ICD-10-CM | POA: Diagnosis not present

## 2018-10-11 DIAGNOSIS — Z125 Encounter for screening for malignant neoplasm of prostate: Secondary | ICD-10-CM | POA: Diagnosis not present

## 2018-10-11 DIAGNOSIS — R972 Elevated prostate specific antigen [PSA]: Secondary | ICD-10-CM | POA: Diagnosis not present

## 2018-10-11 DIAGNOSIS — R350 Frequency of micturition: Secondary | ICD-10-CM | POA: Diagnosis not present

## 2018-10-11 LAB — URINALYSIS, COMPLETE
Bilirubin, UA: NEGATIVE
KETONES UA: NEGATIVE
Leukocytes, UA: NEGATIVE
Nitrite, UA: NEGATIVE
Protein, UA: NEGATIVE
RBC, UA: NEGATIVE
Specific Gravity, UA: 1.02 (ref 1.005–1.030)
Urobilinogen, Ur: 0.2 mg/dL (ref 0.2–1.0)
pH, UA: 7 (ref 5.0–7.5)

## 2018-10-11 LAB — MICROSCOPIC EXAMINATION
Epithelial Cells (non renal): NONE SEEN /hpf (ref 0–10)
WBC, UA: NONE SEEN /hpf (ref 0–5)

## 2018-10-11 LAB — BLADDER SCAN AMB NON-IMAGING: Scan Result: 27

## 2018-10-11 MED ORDER — OXYBUTYNIN CHLORIDE ER 10 MG PO TB24: 10.0000 mg | ORAL_TABLET | Freq: Every day | ORAL | 11 refills | Status: DC

## 2018-10-11 NOTE — Progress Notes (Signed)
10/11/2018 8:22 AM   Russell Holmes Jun 10, 1941 [id number]  Referring provider: Leonel Ramsay, MD [place], [place] [address]  CC: Urinary frequency/urgency, "elevated PSA"  HPI: I saw Russell Holmes in urology clinic today in consultation for urinary frequency and urgency and "elevated PSA" from [place], [place].  The patient is a 78 year old male with no family history of prostate cancer, and past medical history notable for stroke, type 2 diabetes, and hypertension who reports 2 years of urinary symptoms.  His primary complaint is urinary urgency and frequency during the day, and nocturia 7-8 times per night.  He was started on Flomax 6 months previously, which is significantly improved his symptoms and reduce his nocturia to 2-3 times per night.  He denies any history of urinary tract infections or gross hematuria.  He does drink coffee in the morning in the afternoon, as well as 3-4 alcoholic drinks in the afternoon and early evening.  There are no aggravating factors.  Severity is moderate.  IPSS score today is 18, with quality of life terrible.  PVR is 27 cc.  He had a PSA checked in May 2019 that was 6.2, which is normal for his age range.  There are no prior PSA values to compare to.   PMH: DM2 CVA HTN  Surgical History: Past Surgical History:  Procedure Laterality Date  . BREAST LUMPECTOMY    . HERNIA REPAIR      Allergies: No Known Allergies  Family History: Family History  Problem Relation Age of Onset  . Prostate cancer Neg Hx   . Bladder Cancer Neg Hx   . Kidney cancer Neg Hx     Social History:  reports that he has never smoked. He has never used smokeless tobacco. He reports current alcohol use of about 3.0 standard drinks of alcohol per week. No history on file for drug.  ROS: Please see flowsheet from today's date for complete review of systems.  Physical Exam: BP (!) 194/89 (BP Location: Left Arm, Patient Position:  Sitting, Cuff Size: Normal)   Pulse 98   Ht 5\' 4"  (1.626 m)   Wt 146 lb 12.8 oz (66.6 kg)   BMI 25.20 kg/m    Constitutional:  Alert and oriented, No acute distress. Cardiovascular: No clubbing, cyanosis, or edema. Respiratory: Normal respiratory effort, no increased work of breathing. GI: Abdomen is soft, nontender, nondistended, no abdominal masses GU: No CVA tenderness, phallus without lesions, widely patent meatus DRE: Patient refused Lymph: No cervical or inguinal lymphadenopathy. Skin: No rashes, bruises or suspicious lesions. Neurologic: Grossly intact, no focal deficits, moving all 4 extremities. Psychiatric: Normal mood and affect.  Laboratory Data: PSA 02/2018: 6.2  Urinalysis today 0 WBCs, 0-2 RBCs, few bacteria, nitrite negative  Pertinent Imaging: None to review  Assessment & Plan:   In summary, Russell Holmes is a 78 year old male with normal PSA for his age, and moderate urinary symptoms including primarily overactive symptoms.  He has had some improvement on Flomax.  We discussed overactive bladder at length and the relationship to coffee, soda, tea, and alcohol, and the importance of minimizing fluids in the evening and double voiding prior to bed.  We reviewed the implications of screening PSA and the uncertainty surrounding it. In general, a man's PSA increases with age and is produced by both normal and cancerous prostate tissue. The differential diagnosis for elevated PSA includes BPH, prostate cancer, infection, recent intercourse/ejaculation, recent urethroscopic manipulation (foley placement/cystoscopy) or trauma, and prostatitis.  We also discussed his PSA of 6.2 is within the normal range for his age (0-6.5).  The patient adamantly refused DRE today.  Per AUA guidelines, I recommended discontinuing PSA screening.  -Trial of oxybutynin XL 10 mg daily for overactive bladder, risk/benefits and side effects discussed -Continue Flomax -Follow-up in 3 months for  symptom check/PVR/IPSS  Billey Co, MD  [place] [address] [place], [place] [address] [phone]

## 2019-01-10 ENCOUNTER — Ambulatory Visit: Payer: Medicare Other | Admitting: Urology

## 2019-04-05 ENCOUNTER — Ambulatory Visit: Payer: Medicare Other | Admitting: Urology

## 2019-04-05 ENCOUNTER — Encounter: Payer: Self-pay | Admitting: Urology

## 2019-06-08 ENCOUNTER — Encounter (HOSPITAL_COMMUNITY)
Admission: EM | Disposition: A | Discharge status: Home / Self Care | Payer: Self-pay | Source: Other Acute Inpatient Hospital | Attending: Neurological Surgery

## 2019-06-08 ENCOUNTER — Inpatient Hospital Stay (HOSPITAL_COMMUNITY): Payer: Medicare Other | Admitting: Certified Registered Nurse Anesthetist

## 2019-06-08 ENCOUNTER — Other Ambulatory Visit: Payer: Self-pay

## 2019-06-08 ENCOUNTER — Inpatient Hospital Stay (HOSPITAL_COMMUNITY)
Admission: EM | Admit: 2019-06-08 | Discharge: 2019-06-12 | DRG: 027 | Disposition: A | Discharge status: Home / Self Care | Payer: Medicare Other | Source: Other Acute Inpatient Hospital | Attending: Neurological Surgery | Admitting: Neurological Surgery

## 2019-06-08 ENCOUNTER — Encounter: Payer: Self-pay | Admitting: Emergency Medicine

## 2019-06-08 ENCOUNTER — Ambulatory Visit
Admission: RE | Admit: 2019-06-08 | Discharge: 2019-06-08 | Disposition: A | Discharge status: Home / Self Care | Payer: Medicare Other | Source: Ambulatory Visit | Attending: Family Medicine | Admitting: Family Medicine

## 2019-06-08 ENCOUNTER — Emergency Department
Admission: EM | Admit: 2019-06-08 | Discharge: 2019-06-08 | Disposition: A | Discharge status: Other Acute Inpatient Hospital | Payer: Medicare Other | Attending: Student | Admitting: Student

## 2019-06-08 ENCOUNTER — Encounter (HOSPITAL_COMMUNITY): Payer: Self-pay | Admitting: Orthopedic Surgery

## 2019-06-08 ENCOUNTER — Other Ambulatory Visit: Payer: Self-pay | Admitting: Family Medicine

## 2019-06-08 DIAGNOSIS — Z20828 Contact with and (suspected) exposure to other viral communicable diseases: Secondary | ICD-10-CM | POA: Diagnosis present

## 2019-06-08 DIAGNOSIS — R4182 Altered mental status, unspecified: Secondary | ICD-10-CM | POA: Diagnosis present

## 2019-06-08 DIAGNOSIS — Z7984 Long term (current) use of oral hypoglycemic drugs: Secondary | ICD-10-CM

## 2019-06-08 DIAGNOSIS — S065X0A Traumatic subdural hemorrhage without loss of consciousness, initial encounter: Principal | ICD-10-CM | POA: Diagnosis present

## 2019-06-08 DIAGNOSIS — Z7982 Long term (current) use of aspirin: Secondary | ICD-10-CM

## 2019-06-08 DIAGNOSIS — Y92009 Unspecified place in unspecified non-institutional (private) residence as the place of occurrence of the external cause: Secondary | ICD-10-CM

## 2019-06-08 DIAGNOSIS — R402362 Coma scale, best motor response, obeys commands, at arrival to emergency department: Secondary | ICD-10-CM | POA: Diagnosis present

## 2019-06-08 DIAGNOSIS — I6203 Nontraumatic chronic subdural hemorrhage: Secondary | ICD-10-CM | POA: Insufficient documentation

## 2019-06-08 DIAGNOSIS — S065XAA Traumatic subdural hemorrhage with loss of consciousness status unknown, initial encounter: Secondary | ICD-10-CM

## 2019-06-08 DIAGNOSIS — R296 Repeated falls: Secondary | ICD-10-CM

## 2019-06-08 DIAGNOSIS — W1830XA Fall on same level, unspecified, initial encounter: Secondary | ICD-10-CM | POA: Diagnosis present

## 2019-06-08 DIAGNOSIS — S065X9A Traumatic subdural hemorrhage with loss of consciousness of unspecified duration, initial encounter: Secondary | ICD-10-CM

## 2019-06-08 DIAGNOSIS — Z79899 Other long term (current) drug therapy: Secondary | ICD-10-CM | POA: Diagnosis not present

## 2019-06-08 DIAGNOSIS — R519 Headache, unspecified: Secondary | ICD-10-CM

## 2019-06-08 DIAGNOSIS — R531 Weakness: Secondary | ICD-10-CM

## 2019-06-08 DIAGNOSIS — R402142 Coma scale, eyes open, spontaneous, at arrival to emergency department: Secondary | ICD-10-CM | POA: Diagnosis present

## 2019-06-08 DIAGNOSIS — R402252 Coma scale, best verbal response, oriented, at arrival to emergency department: Secondary | ICD-10-CM | POA: Diagnosis present

## 2019-06-08 DIAGNOSIS — I10 Essential (primary) hypertension: Secondary | ICD-10-CM | POA: Diagnosis present

## 2019-06-08 DIAGNOSIS — Z7902 Long term (current) use of antithrombotics/antiplatelets: Secondary | ICD-10-CM

## 2019-06-08 DIAGNOSIS — R51 Headache: Secondary | ICD-10-CM | POA: Insufficient documentation

## 2019-06-08 HISTORY — PX: BURR HOLE: SHX908

## 2019-06-08 LAB — CBC WITH DIFFERENTIAL/PLATELET
Abs Immature Granulocytes: 0.01 10*3/uL (ref 0.00–0.07)
Basophils Absolute: 0.1 10*3/uL (ref 0.0–0.1)
Basophils Relative: 1 %
Eosinophils Absolute: 0.1 10*3/uL (ref 0.0–0.5)
Eosinophils Relative: 1 %
HCT: 38.3 % — ABNORMAL LOW (ref 39.0–52.0)
Hemoglobin: 13 g/dL (ref 13.0–17.0)
Immature Granulocytes: 0 %
Lymphocytes Relative: 26 %
Lymphs Abs: 1.7 10*3/uL (ref 0.7–4.0)
MCH: 33.2 pg (ref 26.0–34.0)
MCHC: 33.9 g/dL (ref 30.0–36.0)
MCV: 97.7 fL (ref 80.0–100.0)
Monocytes Absolute: 0.7 10*3/uL (ref 0.1–1.0)
Monocytes Relative: 10 %
Neutro Abs: 4 10*3/uL (ref 1.7–7.7)
Neutrophils Relative %: 62 %
Platelets: 230 10*3/uL (ref 150–400)
RBC: 3.92 MIL/uL — ABNORMAL LOW (ref 4.22–5.81)
RDW: 12.9 % (ref 11.5–15.5)
WBC: 6.5 10*3/uL (ref 4.0–10.5)
nRBC: 0 % (ref 0.0–0.2)

## 2019-06-08 LAB — COMPREHENSIVE METABOLIC PANEL
ALT: 21 U/L (ref 0–44)
AST: 25 U/L (ref 15–41)
Albumin: 4.7 g/dL (ref 3.5–5.0)
Alkaline Phosphatase: 46 U/L (ref 38–126)
Anion gap: 10 (ref 5–15)
BUN: 20 mg/dL (ref 8–23)
CO2: 25 mmol/L (ref 22–32)
Calcium: 9.9 mg/dL (ref 8.9–10.3)
Chloride: 107 mmol/L (ref 98–111)
Creatinine, Ser: 0.7 mg/dL (ref 0.61–1.24)
GFR calc Af Amer: >60 mL/min (ref >60)
GFR calc non Af Amer: >60 mL/min (ref >60)
Glucose, Bld: 98 mg/dL (ref 70–99)
Potassium: 3.6 mmol/L (ref 3.5–5.1)
Sodium: 142 mmol/L (ref 135–145)
Total Bilirubin: 0.6 mg/dL (ref 0.3–1.2)
Total Protein: 7.5 g/dL (ref 6.5–8.1)

## 2019-06-08 LAB — PROTIME-INR
INR: 1 (ref 0.8–1.2)
Prothrombin Time: 12.8 seconds (ref 11.4–15.2)

## 2019-06-08 LAB — SARS CORONAVIRUS 2 BY RT PCR (HOSPITAL ORDER, PERFORMED IN ~~LOC~~ HOSPITAL LAB): SARS Coronavirus 2: NEGATIVE

## 2019-06-08 LAB — GLUCOSE, CAPILLARY
Glucose-Capillary: 90 mg/dL (ref 70–99)
Glucose-Capillary: 116 mg/dL — ABNORMAL HIGH (ref 70–99)

## 2019-06-08 LAB — ETHANOL: Alcohol, Ethyl (B): <10 mg/dL (ref <10)

## 2019-06-08 LAB — APTT: aPTT: 25 seconds (ref 24–36)

## 2019-06-08 SURGERY — CREATION, CRANIAL BURR HOLE
Anesthesia: General | Site: Head | Laterality: Right

## 2019-06-08 MED ORDER — THROMBIN 5000 UNITS EX SOLR
CUTANEOUS | Status: AC
  Filled 2019-06-08: qty 5000

## 2019-06-08 MED ORDER — POVIDONE-IODINE 10 % EX OINT
TOPICAL_OINTMENT | CUTANEOUS | Status: AC
  Filled 2019-06-08: qty 28.35

## 2019-06-08 MED ORDER — ONDANSETRON HCL 4 MG/2ML IJ SOLN: 4.0000 mg | INTRAMUSCULAR | Status: DC | PRN

## 2019-06-08 MED ORDER — CHLORHEXIDINE GLUCONATE CLOTH 2 % EX PADS
6.0000 | MEDICATED_PAD | Freq: Every day | CUTANEOUS | Status: DC
  Administered 2019-06-09 – 2019-06-10 (×2): 6 via TOPICAL

## 2019-06-08 MED ORDER — LEVETIRACETAM IN NACL 500 MG/100ML IV SOLN
500.0000 mg | Freq: Two times a day (BID) | INTRAVENOUS | Status: DC
  Administered 2019-06-08 – 2019-06-10 (×5): 500 mg via INTRAVENOUS
  Filled 2019-06-08 (×5): qty 100

## 2019-06-08 MED ORDER — VANCOMYCIN HCL 1000 MG IV SOLR
INTRAVENOUS | Status: AC
  Filled 2019-06-08: qty 1000

## 2019-06-08 MED ORDER — LIDOCAINE 2% (20 MG/ML) 5 ML SYRINGE
INTRAMUSCULAR | Status: AC
  Filled 2019-06-08: qty 5

## 2019-06-08 MED ORDER — PHENYLEPHRINE 40 MCG/ML (10ML) SYRINGE FOR IV PUSH (FOR BLOOD PRESSURE SUPPORT)
PREFILLED_SYRINGE | INTRAVENOUS | Status: DC | PRN
  Administered 2019-06-08 (×3): 80 ug via INTRAVENOUS

## 2019-06-08 MED ORDER — LIDOCAINE-EPINEPHRINE 1 %-1:100000 IJ SOLN
INTRAMUSCULAR | Status: DC | PRN
  Administered 2019-06-08: 2.5 mL via INTRADERMAL

## 2019-06-08 MED ORDER — ONDANSETRON HCL 4 MG/2ML IJ SOLN
INTRAMUSCULAR | Status: DC | PRN
  Administered 2019-06-08: 4 mg via INTRAVENOUS

## 2019-06-08 MED ORDER — METFORMIN HCL 500 MG PO TABS
1000.0000 mg | ORAL_TABLET | Freq: Every day | ORAL | Status: DC
  Administered 2019-06-09 – 2019-06-12 (×4): 1000 mg via ORAL
  Filled 2019-06-08 (×4): qty 2

## 2019-06-08 MED ORDER — LIDOCAINE 2% (20 MG/ML) 5 ML SYRINGE
INTRAMUSCULAR | Status: DC | PRN
  Administered 2019-06-08: 70 mg via INTRAVENOUS

## 2019-06-08 MED ORDER — ONDANSETRON HCL 4 MG/2ML IJ SOLN
INTRAMUSCULAR | Status: AC
  Filled 2019-06-08: qty 2

## 2019-06-08 MED ORDER — PROPOFOL 10 MG/ML IV BOLUS
INTRAVENOUS | Status: AC
  Filled 2019-06-08: qty 20

## 2019-06-08 MED ORDER — EPHEDRINE SULFATE 50 MG/ML IJ SOLN
INTRAMUSCULAR | Status: DC | PRN
  Administered 2019-06-08 (×2): 5 mg via INTRAVENOUS

## 2019-06-08 MED ORDER — THROMBIN 5000 UNITS EX SOLR
OROMUCOSAL | Status: DC | PRN
  Administered 2019-06-08: 5 mL via TOPICAL

## 2019-06-08 MED ORDER — CEFAZOLIN SODIUM-DEXTROSE 2-3 GM-%(50ML) IV SOLR
INTRAVENOUS | Status: DC | PRN
  Administered 2019-06-08: 2 g via INTRAVENOUS

## 2019-06-08 MED ORDER — FENTANYL CITRATE (PF) 100 MCG/2ML IJ SOLN: 25.0000 ug | INTRAMUSCULAR | Status: DC | PRN

## 2019-06-08 MED ORDER — 0.9 % SODIUM CHLORIDE (POUR BTL) OPTIME
TOPICAL | Status: DC | PRN
  Administered 2019-06-08 (×2): 1000 mL

## 2019-06-08 MED ORDER — POLYETHYLENE GLYCOL 3350 17 G PO PACK: 17.0000 g | PACK | Freq: Every day | ORAL | Status: DC | PRN

## 2019-06-08 MED ORDER — LABETALOL HCL 5 MG/ML IV SOLN: 10.0000 mg | INTRAVENOUS | Status: DC | PRN

## 2019-06-08 MED ORDER — PROPOFOL 10 MG/ML IV BOLUS
INTRAVENOUS | Status: DC | PRN
  Administered 2019-06-08: 150 mg via INTRAVENOUS

## 2019-06-08 MED ORDER — LIDOCAINE-EPINEPHRINE 1 %-1:100000 IJ SOLN
INTRAMUSCULAR | Status: AC
  Filled 2019-06-08: qty 1

## 2019-06-08 MED ORDER — BRIMONIDINE TARTRATE-TIMOLOL 0.2-0.5 % OP SOLN
1.0000 [drp] | Freq: Two times a day (BID) | OPHTHALMIC | Status: DC
  Filled 2019-06-08: qty 5

## 2019-06-08 MED ORDER — ESMOLOL HCL 100 MG/10ML IV SOLN
INTRAVENOUS | Status: DC | PRN
  Administered 2019-06-08: 20 mg via INTRAVENOUS

## 2019-06-08 MED ORDER — PANTOPRAZOLE SODIUM 40 MG IV SOLR
40.0000 mg | Freq: Every day | INTRAVENOUS | Status: DC
  Administered 2019-06-09: 40 mg via INTRAVENOUS

## 2019-06-08 MED ORDER — SODIUM CHLORIDE 0.9 % IV SOLN
INTRAVENOUS | Status: DC
  Administered 2019-06-08 – 2019-06-09 (×2): via INTRAVENOUS

## 2019-06-08 MED ORDER — FENTANYL CITRATE (PF) 250 MCG/5ML IJ SOLN
INTRAMUSCULAR | Status: DC | PRN
  Administered 2019-06-08 (×2): 50 ug via INTRAVENOUS

## 2019-06-08 MED ORDER — BUPIVACAINE HCL (PF) 0.25 % IJ SOLN
INTRAMUSCULAR | Status: DC | PRN
  Administered 2019-06-08: 2.5 mL

## 2019-06-08 MED ORDER — PANTOPRAZOLE SODIUM 20 MG PO TBEC
20.0000 mg | DELAYED_RELEASE_TABLET | Freq: Every day | ORAL | Status: DC
  Administered 2019-06-09 – 2019-06-12 (×4): 20 mg via ORAL
  Filled 2019-06-08 (×4): qty 1

## 2019-06-08 MED ORDER — FENTANYL CITRATE (PF) 250 MCG/5ML IJ SOLN
INTRAMUSCULAR | Status: AC
  Filled 2019-06-08: qty 5

## 2019-06-08 MED ORDER — THROMBIN 20000 UNITS EX SOLR
CUTANEOUS | Status: AC
  Filled 2019-06-08: qty 20000

## 2019-06-08 MED ORDER — BUPIVACAINE HCL (PF) 0.25 % IJ SOLN
INTRAMUSCULAR | Status: AC
  Filled 2019-06-08: qty 30

## 2019-06-08 MED ORDER — HEMOSTATIC AGENTS (NO CHARGE) OPTIME
TOPICAL | Status: DC | PRN
  Administered 2019-06-08: 1 via TOPICAL

## 2019-06-08 MED ORDER — SENNA 8.6 MG PO TABS
1.0000 | ORAL_TABLET | Freq: Two times a day (BID) | ORAL | Status: DC
  Administered 2019-06-09 – 2019-06-12 (×5): 8.6 mg via ORAL
  Filled 2019-06-08 (×5): qty 1

## 2019-06-08 MED ORDER — BISACODYL 10 MG RE SUPP: 10.0000 mg | Freq: Every day | RECTAL | Status: DC | PRN

## 2019-06-08 MED ORDER — ROCURONIUM BROMIDE 10 MG/ML (PF) SYRINGE
PREFILLED_SYRINGE | INTRAVENOUS | Status: DC | PRN
  Administered 2019-06-08: 40 mg via INTRAVENOUS

## 2019-06-08 MED ORDER — SERTRALINE HCL 50 MG PO TABS
50.0000 mg | ORAL_TABLET | Freq: Every day | ORAL | Status: DC
  Administered 2019-06-09 – 2019-06-12 (×4): 50 mg via ORAL
  Filled 2019-06-08 (×4): qty 1

## 2019-06-08 MED ORDER — DEXAMETHASONE SODIUM PHOSPHATE 10 MG/ML IJ SOLN
INTRAMUSCULAR | Status: DC | PRN
  Administered 2019-06-08: 10 mg via INTRAVENOUS

## 2019-06-08 MED ORDER — SUCCINYLCHOLINE CHLORIDE 200 MG/10ML IV SOSY
PREFILLED_SYRINGE | INTRAVENOUS | Status: DC | PRN
  Administered 2019-06-08: 110 mg via INTRAVENOUS

## 2019-06-08 MED ORDER — OXYBUTYNIN CHLORIDE ER 10 MG PO TB24
10.0000 mg | ORAL_TABLET | Freq: Every day | ORAL | Status: DC
  Administered 2019-06-09 – 2019-06-12 (×4): 10 mg via ORAL
  Filled 2019-06-08 (×4): qty 1

## 2019-06-08 MED ORDER — FLEET ENEMA 7-19 GM/118ML RE ENEM: 1.0000 | ENEMA | Freq: Once | RECTAL | Status: DC | PRN

## 2019-06-08 MED ORDER — CEFAZOLIN SODIUM-DEXTROSE 2-4 GM/100ML-% IV SOLN
INTRAVENOUS | Status: AC
  Filled 2019-06-08: qty 100

## 2019-06-08 MED ORDER — PRAVASTATIN SODIUM 40 MG PO TABS
40.0000 mg | ORAL_TABLET | Freq: Every day | ORAL | Status: DC
  Administered 2019-06-09 – 2019-06-11 (×3): 40 mg via ORAL
  Filled 2019-06-08 (×3): qty 1

## 2019-06-08 MED ORDER — HYDROCODONE-ACETAMINOPHEN 5-325 MG PO TABS: 1.0000 | ORAL_TABLET | ORAL | Status: DC | PRN

## 2019-06-08 MED ORDER — QUINAPRIL HCL 10 MG PO TABS
40.0000 mg | ORAL_TABLET | Freq: Every day | ORAL | Status: DC
  Administered 2019-06-09 – 2019-06-12 (×4): 40 mg via ORAL
  Filled 2019-06-08 (×4): qty 4

## 2019-06-08 MED ORDER — DOCUSATE SODIUM 100 MG PO CAPS
100.0000 mg | ORAL_CAPSULE | Freq: Two times a day (BID) | ORAL | Status: DC
  Administered 2019-06-09 – 2019-06-12 (×7): 100 mg via ORAL
  Filled 2019-06-08 (×7): qty 1

## 2019-06-08 MED ORDER — ONDANSETRON HCL 4 MG PO TABS: 4.0000 mg | ORAL_TABLET | ORAL | Status: DC | PRN

## 2019-06-08 MED ORDER — ROCURONIUM BROMIDE 10 MG/ML (PF) SYRINGE
PREFILLED_SYRINGE | INTRAVENOUS | Status: AC
  Filled 2019-06-08: qty 10

## 2019-06-08 MED ORDER — SUGAMMADEX SODIUM 200 MG/2ML IV SOLN
INTRAVENOUS | Status: DC | PRN
  Administered 2019-06-08: 150 mg via INTRAVENOUS

## 2019-06-08 MED ORDER — LATANOPROST 0.005 % OP SOLN
1.0000 [drp] | Freq: Every day | OPHTHALMIC | Status: DC
  Administered 2019-06-08 – 2019-06-11 (×4): 1 [drp] via OPHTHALMIC
  Filled 2019-06-08: qty 2.5

## 2019-06-08 MED ORDER — TIMOLOL MALEATE 0.5 % OP SOLN
1.0000 [drp] | Freq: Two times a day (BID) | OPHTHALMIC | Status: DC
  Administered 2019-06-09 – 2019-06-11 (×7): 1 [drp] via OPHTHALMIC
  Filled 2019-06-08: qty 5

## 2019-06-08 MED ORDER — BRIMONIDINE TARTRATE 0.2 % OP SOLN
1.0000 [drp] | Freq: Two times a day (BID) | OPHTHALMIC | Status: DC
  Administered 2019-06-09 – 2019-06-11 (×6): 1 [drp] via OPHTHALMIC
  Filled 2019-06-08: qty 5

## 2019-06-08 MED ORDER — INSULIN ASPART 100 UNIT/ML ~~LOC~~ SOLN
0.0000 [IU] | Freq: Three times a day (TID) | SUBCUTANEOUS | Status: DC
  Administered 2019-06-09: 8 [IU] via SUBCUTANEOUS
  Administered 2019-06-09: 2 [IU] via SUBCUTANEOUS
  Administered 2019-06-10: 12:00:00 5 [IU] via SUBCUTANEOUS
  Administered 2019-06-11 (×2): 2 [IU] via SUBCUTANEOUS
  Administered 2019-06-11: 5 [IU] via SUBCUTANEOUS

## 2019-06-08 MED ORDER — AMLODIPINE BESYLATE 5 MG PO TABS
5.0000 mg | ORAL_TABLET | Freq: Every day | ORAL | Status: DC
  Administered 2019-06-09 – 2019-06-12 (×4): 5 mg via ORAL
  Filled 2019-06-08 (×4): qty 1

## 2019-06-08 MED ORDER — PROMETHAZINE HCL 25 MG PO TABS: 12.5000 mg | ORAL_TABLET | ORAL | Status: DC | PRN

## 2019-06-08 SURGICAL SUPPLY — 65 items
BAG DECANTER FOR FLEXI CONT (MISCELLANEOUS) ×3 IMPLANT
BNDG GAUZE ELAST 4 BULKY (GAUZE/BANDAGES/DRESSINGS) ×3 IMPLANT
BUR ACORN 6.0 (BURR) ×2 IMPLANT
BUR ACORN 6.0MM (BURR) ×1
BUR SPIRAL ROUTER 2.3 (BUR) ×2 IMPLANT
BUR SPIRAL ROUTER 2.3MM (BUR) ×1
CABLE BIPOLOR RESECTION CORD (MISCELLANEOUS) ×3 IMPLANT
CANISTER SUCT 3000ML PPV (MISCELLANEOUS) ×6 IMPLANT
CARTRIDGE OIL MAESTRO DRILL (MISCELLANEOUS) IMPLANT
CATH VENTRIC 35X38 W/TROCAR LG (CATHETERS) ×3 IMPLANT
CLIP VESOCCLUDE MED 6/CT (CLIP) IMPLANT
COVER WAND RF STERILE (DRAPES) ×3 IMPLANT
DECANTER SPIKE VIAL GLASS SM (MISCELLANEOUS) ×3 IMPLANT
DIFFUSER DRILL AIR PNEUMATIC (MISCELLANEOUS) ×3 IMPLANT
DRAIN CHANNEL 10M FLAT 3/4 FLT (DRAIN) IMPLANT
DRAIN PENROSE 1/2X12 LTX STRL (WOUND CARE) IMPLANT
DRAPE WARM FLUID 44X44 (DRAPES) ×3 IMPLANT
DRSG ADAPTIC 3X8 NADH LF (GAUZE/BANDAGES/DRESSINGS) IMPLANT
DRSG OPSITE 4X5.5 SM (GAUZE/BANDAGES/DRESSINGS) ×6 IMPLANT
DRSG PAD ABDOMINAL 8X10 ST (GAUZE/BANDAGES/DRESSINGS) IMPLANT
DRSG TELFA 3X8 NADH (GAUZE/BANDAGES/DRESSINGS) ×3 IMPLANT
DURAPREP 6ML APPLICATOR 50/CS (WOUND CARE) ×3 IMPLANT
ELECT CAUTERY BLADE 6.4 (BLADE) ×3 IMPLANT
ELECT REM PT RETURN 9FT ADLT (ELECTROSURGICAL) ×3
ELECTRODE REM PT RTRN 9FT ADLT (ELECTROSURGICAL) ×1 IMPLANT
EVACUATOR SILICONE 100CC (DRAIN) IMPLANT
GAUZE 4X4 16PLY RFD (DISPOSABLE) IMPLANT
GAUZE SPONGE 4X4 12PLY STRL (GAUZE/BANDAGES/DRESSINGS) IMPLANT
GLOVE BIOGEL M 7.0 STRL (GLOVE) ×3 IMPLANT
GLOVE BIOGEL PI IND STRL 7.5 (GLOVE) ×1 IMPLANT
GLOVE BIOGEL PI IND STRL 8.5 (GLOVE) ×1 IMPLANT
GLOVE BIOGEL PI INDICATOR 7.5 (GLOVE) ×2
GLOVE BIOGEL PI INDICATOR 8.5 (GLOVE) ×2
GLOVE ECLIPSE 8.5 STRL (GLOVE) ×6 IMPLANT
GLOVE EXAM NITRILE XL STR (GLOVE) IMPLANT
GOWN STRL REUS W/ TWL LRG LVL3 (GOWN DISPOSABLE) ×1 IMPLANT
GOWN STRL REUS W/ TWL XL LVL3 (GOWN DISPOSABLE) ×1 IMPLANT
GOWN STRL REUS W/TWL 2XL LVL3 (GOWN DISPOSABLE) ×3 IMPLANT
GOWN STRL REUS W/TWL LRG LVL3 (GOWN DISPOSABLE) ×2
GOWN STRL REUS W/TWL XL LVL3 (GOWN DISPOSABLE) ×2
HEMOSTAT SURGICEL 2X14 (HEMOSTASIS) IMPLANT
HOOK DURA (MISCELLANEOUS) IMPLANT
KIT BASIN OR (CUSTOM PROCEDURE TRAY) ×3 IMPLANT
KIT TURNOVER KIT B (KITS) ×3 IMPLANT
NEEDLE HYPO 22GX1.5 SAFETY (NEEDLE) ×3 IMPLANT
NS IRRIG 1000ML POUR BTL (IV SOLUTION) ×3 IMPLANT
OIL CARTRIDGE MAESTRO DRILL (MISCELLANEOUS)
PACK CRANIOTOMY CUSTOM (CUSTOM PROCEDURE TRAY) ×3 IMPLANT
PATTIES SURGICAL .5 X.5 (GAUZE/BANDAGES/DRESSINGS) IMPLANT
PATTIES SURGICAL .5 X3 (DISPOSABLE) IMPLANT
PATTIES SURGICAL 1X1 (DISPOSABLE) IMPLANT
PIN MAYFIELD SKULL DISP (PIN) IMPLANT
SPECIMEN JAR SMALL (MISCELLANEOUS) IMPLANT
SPONGE NEURO XRAY DETECT 1X3 (DISPOSABLE) IMPLANT
SPONGE SURGIFOAM ABS GEL 100 (HEMOSTASIS) ×3 IMPLANT
STAPLER SKIN PROX WIDE 3.9 (STAPLE) ×3 IMPLANT
SUT ETHILON 3 0 FSL (SUTURE) IMPLANT
SUT NURALON 4 0 TR CR/8 (SUTURE) ×3 IMPLANT
SUT VIC AB 2-0 CP2 18 (SUTURE) ×9 IMPLANT
SYR CONTROL 10ML LL (SYRINGE) ×3 IMPLANT
TOWEL GREEN STERILE (TOWEL DISPOSABLE) ×3 IMPLANT
TOWEL GREEN STERILE FF (TOWEL DISPOSABLE) ×3 IMPLANT
TRAY FOLEY MTR SLVR 16FR STAT (SET/KITS/TRAYS/PACK) IMPLANT
UNDERPAD 30X30 (UNDERPADS AND DIAPERS) IMPLANT
WATER STERILE IRR 1000ML POUR (IV SOLUTION) ×3 IMPLANT

## 2019-06-08 NOTE — ED Notes (Signed)
Pt using urinal.

## 2019-06-08 NOTE — ED Notes (Signed)
Report given to [facility] 

## 2019-06-08 NOTE — Op Note (Signed)
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  A CT scan was done in [place] today and this demonstrated presence of a subdural hematoma that was large chronic with shift and mass-effect from the right to the left he is advised regarding surgery and is taken to the operating room.  Procedure: Patient was brought to the operating room supine on the stretcher after the smooth induction of general endotracheal anesthesia he was placed on table supine position.  He was placed on a donut head holder and the head was turned slightly to the left with a bump under the right shoulder.  The right parietal scalp was shaved prepped with alcohol DuraPrep and draped in a sterile fashion.  Then a paramedian incision was made in the frontal boss as was 1 the parietal boss.  This was after infiltrating with lidocaine with 1% light epinephrine.  The galea was identified and divided self-retaining retractor was placed in the wound and a bur hole was created using a high-speed bur and a 5 mm acorn bit the dura was identified and it was cauterized.  This was done in both areas and then a 15 blade was used to pierce the dura in a cruciate fashion.  Immediately under moderate pressure this chronic subdural blood and fluid came out of both incisions.  A small ventriculostomy catheter was used to insert into the subdural space and gradually irrigate out all the discolored blood products.  This was done from each hole with the top draining down in the bottom draining up.  Ultimately when the effluent was completely  clear it was decided to close the bottom incision with 2-0 Vicryl in the galea and surgical staples and place a ventriculostomy catheter in the subdural space in the upper incision and irrigate until clear.  Once this was achieved the catheter was brought out through separate stab incision and the galea was closed with 2-0 Vicryl and this surgical staples were used in the scalp.  The system was connected to a closed drainage system without any suction.  A dry sterile dressing was applied to the scalp and the patient was awakened and returned to the recovery room in stable condition.

## 2019-06-08 NOTE — Anesthesia Preprocedure Evaluation (Signed)
Anesthesia Evaluation  Patient identified by MRN, date of birth, ID band Patient awake  General Assessment Comment:History noted. CG  Reviewed: Allergy & Precautions, NPO status , Patient's Chart, lab work & pertinent test results  Airway Mallampati: II  TM Distance: >3 FB     Dental   Pulmonary    breath sounds clear to auscultation       Cardiovascular negative cardio ROS   Rhythm:Regular Rate:Normal     Neuro/Psych    GI/Hepatic negative GI ROS, Neg liver ROS,   Endo/Other    Renal/GU negative Renal ROS     Musculoskeletal   Abdominal   Peds  Hematology   Anesthesia Other Findings   Reproductive/Obstetrics                             Anesthesia Physical Anesthesia Plan  ASA: III  Anesthesia Plan: General   Post-op Pain Management:    Induction: Intravenous  PONV Risk Score and Plan: 2 and Ondansetron, Dexamethasone and Midazolam  Airway Management Planned: Oral ETT  Additional Equipment:   Intra-op Plan:   Post-operative Plan: Possible Post-op intubation/ventilation  Informed Consent: I have reviewed the patients History and Physical, chart, labs and discussed the procedure including the risks, benefits and alternatives for the proposed anesthesia with the patient or authorized representative who has indicated his/her understanding and acceptance.     Dental advisory given  Plan Discussed with: CRNA and Anesthesiologist  Anesthesia Plan Comments:         Anesthesia Quick Evaluation

## 2019-06-08 NOTE — Addendum Note (Signed)
Addendum  created 06/08/19 2222 by Shirlyn Goltz, CRNA   Intraprocedure Meds edited

## 2019-06-08 NOTE — ED Provider Notes (Signed)
[facility] Emergency Department Provider Note  ____________________________________________   First MD Initiated Contact with Patient 06/08/19 1247     (approximate)  I have reviewed the triage vital signs and the nursing notes.  History  Chief Complaint No chief complaint on file.    HPI Russell Holmes is a 78 y.o. male with hx of hypertension, diabetes, who presents emergency department for abnormal head CT -- patient has a large right-sided subdural hematoma with 8 mm of midline shift.  Patient was seen in his PCP clinic today for several days of unsteady gait, headache, increased falls.  Per wife at bedside, the patient fell both on Sunday and Monday.  However, she specifically denies any head trauma related to this.  The patient states he fell because he felt unsteady.  The wife does note that he fell walking the dog approximately 1 month ago and did hit his head at that time, but no other head trauma since then.  The wife denies any changes in his mental status.  Patient denies any headache, visual changes, nausea, vomiting, weakness, numbness, tingling.  He takes a baby aspirin daily, but no other anticoagulation or antiplatelet medication. He does not smoke. He drinks 2-3 beers, and 2-3 shots of liquor daily.          Past Medical Hx History reviewed. No pertinent past medical history.  Problem List There are no active problems to display for this patient.   Past Surgical Hx Past Surgical History:  Procedure Laterality Date  . BREAST LUMPECTOMY    . HERNIA REPAIR      Medications Prior to Admission medications   Medication Sig Start Date End Date Taking? Authorizing Provider  amLODipine (NORVASC) 5 MG tablet TAKE 1 TABLET BY MOUTH EVERY DAY 07/27/18   [provider]  aspirin EC 81 MG tablet Take by mouth.    [provider]  brimonidine-timolol (COMBIGAN) 0.2-0.5 % ophthalmic solution Apply to eye.    [provider]  glucose blood (PRECISION QID TEST) test strip Use 1 each 2 (two) times daily. Use as instructed.    [provider]  lovastatin (MEVACOR) 40 MG tablet TAKE 1 TABLET(40 MG) BY MOUTH EVERY DAY 06/21/18   [provider]  metFORMIN (GLUCOPHAGE) 500 MG tablet TAKE 1 TABLET BY MOUTH TWICE DAILY WITH MEALS 07/18/18   [provider]  oxybutynin (DITROPAN-XL) 10 MG 24 hr tablet Take 1 tablet (10 mg total) by mouth daily. 10/11/18   Billey Co, MD  pantoprazole (PROTONIX) 20 MG tablet Take by mouth. 08/22/18 08/22/19  [provider]  quinapril (ACCUPRIL) 40 MG tablet TAKE 1 TABLET(40 MG) BY MOUTH EVERY DAY 07/18/18   [provider]  sertraline (ZOLOFT) 50 MG tablet TAKE 1 TABLET BY MOUTH EVERY DAY 07/22/18   [provider]  tamsulosin (FLOMAX) 0.4 MG CAPS capsule Take by mouth. 09/19/18 09/19/19  [provider]    Allergies Patient has no known allergies.  Family Hx Family History  Problem Relation Age of Onset  . Prostate cancer Neg Hx   . Bladder Cancer Neg Hx   . Kidney cancer Neg Hx     Social Hx Social History   Tobacco Use  . Smoking status: Never Smoker  . Smokeless tobacco: Never Used  Substance Use Topics  . Alcohol use: Yes    Alcohol/week: 3.0 standard drinks    Types: 3 Cans of beer per week  . Drug use: Not on file  Review of Systems  Constitutional: Negative for fever. Negative for chills. Eyes: Negative for visual changes. ENT: Negative for sore throat. Cardiovascular: Negative for chest pain. Respiratory: Negative for shortness of breath. Gastrointestinal: Negative for abdominal pain. Negative for nausea. Negative for vomiting. Genitourinary: Negative for dysuria. Musculoskeletal: Negative for leg swelling. Skin: Negative for rash. Neurological: + frequent falls, unsteady gait, headache   Physical Exam  Vital Signs: ED Triage Vitals [06/08/19 1237]  Enc Vitals Group      BP (!) 147/99     Pulse Rate 70     Resp 16     Temp 98.1 F (36.7 C)     Temp Source Oral     SpO2 99 %     Weight      Height      Head Circumference      Peak Flow      Pain Score 0     Pain Loc      Pain Edu?      Excl. in [place]?     Constitutional: Alert and oriented.  Eyes: Conjunctivae clear. Sclera anicteric. Head: Normocephalic. Atraumatic. Nose: No congestion. No rhinorrhea. Mouth/Throat: Mucous membranes are moist.  Neck: No stridor.   Cardiovascular: Normal rate, regular rhythm. No murmurs. Extremities well perfused. Respiratory: Normal respiratory effort.  Lungs CTAB. Gastrointestinal: Soft and non-tender. No distention.  Musculoskeletal: No lower extremity edema. Neurologic:  Normal speech and language. Alert and oriented.  Face symmetric.  Tongue midline.  Cranial nerves II through XII intact. UE and LE strength 5/5 and symmetric. UE and LE SILT.  Skin: Skin is warm, dry and intact. No rash noted. Psychiatric: Mood and affect are appropriate for situation.  EKG  Personally reviewed.   Rate: 75 Rhythm: sinus Axis: normal Intervals: WNL PVCs No STEMI    Radiology  CTH: IMPRESSION: 1. Large acute right supratentorial subdural hematoma measuring up to 3 cm in thickness, with significant underlying brain parenchymal edema. 2. Resultant compression of the right lateral ventricle and 8 mm leftward midline shift.   Procedures  Procedure(s) performed (including critical care):  .Critical Care Performed by: Lilia Pro., MD Authorized by: Lilia Pro., MD   Critical care provider statement:    Critical care time (minutes):  30   Critical care was necessary to treat or prevent imminent or life-threatening deterioration of the following conditions:  CNS failure or compromise   Critical care was time spent personally by me on the following activities:  Discussions with consultants, evaluation of patient's response to treatment, examination of  patient, ordering and performing treatments and interventions, ordering and review of laboratory studies, ordering and review of radiographic studies, pulse oximetry, re-evaluation of patient's condition, obtaining history from patient or surrogate and review of old charts     Initial Impression / Assessment and Plan / ED Course  78 y.o. male who presents to the ED for headache, unsteady gait, frequent falls, with subdural hematoma noted on head CT obtained today.  On exam, he is neurologically intact.  Blood pressure 140s over 90s.  Plan: labs, will need transfer for NSGY   1:38 PM Discussed case with Dr. Ellene Route, of [place] at [facility], who recommends ED to ED transfer for further evaluation, likely admission.   1:45 PM Discussed with Dr. Darl Householder, of ED, who accepts patient for ED to ED transfer.  2:30 PM [facility] here for transport.  He remains neurologically intact.  Last blood pressure 138/66.   Final Clinical Impression(s) /  ED Diagnosis  Final diagnoses:  Subdural hematoma ([place])     Note:  This document was prepared using Dragon voice recognition software and may include unintentional dictation errors.   Lilia Pro., MD 06/08/19 [phone]

## 2019-06-08 NOTE — H&P (Signed)
Russell Holmes is an 78 y.o. male.   Chief Complaint: Subdural hematoma HPI: The patient is a 78 year old right-handed individual who has been having increasing number of falls and some confusion is noted by his wife and generally not acting himself.  Use taken to the emergency department where a CT scan of the head was performed and this demonstrated presence of a large right-sided subdural hematoma that was chronic and subacute.  He was initially seen at the [facility] emergency room and they desire to be transferred to [facility].  He is now admitted to undergo surgical bur hole drainage of the a large subdural hematoma in the right frontoparietal region with significant shift and mass-effect.  History reviewed. No pertinent past medical history.  Past Surgical History:  Procedure Laterality Date  . BREAST LUMPECTOMY    . HERNIA REPAIR      Family History  Problem Relation Age of Onset  . Prostate cancer Neg Hx   . Bladder Cancer Neg Hx   . Kidney cancer Neg Hx    Social History:  reports that he has never smoked. He has never used smokeless tobacco. He reports current alcohol use of about 3.0 standard drinks of alcohol per week. No history on file for drug.  Allergies: No Known Allergies  Medications Prior to Admission  Medication Sig Dispense Refill  . amLODipine (NORVASC) 5 MG tablet Take 5 mg by mouth daily.     Marland Kitchen aspirin EC 81 MG tablet Take by mouth.    . brimonidine (ALPHAGAN) 0.2 % ophthalmic solution Place 1 drop into both eyes 2 (two) times daily.     . brimonidine-timolol (COMBIGAN) 0.2-0.5 % ophthalmic solution Place 1 drop into both eyes every 12 (twelve) hours.     . clopidogrel (PLAVIX) 75 MG tablet Take 75 mg by mouth daily.    Marland Kitchen latanoprost (XALATAN) 0.005 % ophthalmic solution Place 1 drop into both eyes at bedtime.    . lovastatin (MEVACOR) 40 MG tablet Take 40 mg by mouth daily.     . metFORMIN (GLUCOPHAGE) 500 MG tablet Take 1,000 mg by mouth daily with  breakfast.     . oxybutynin (DITROPAN-XL) 10 MG 24 hr tablet Take 1 tablet (10 mg total) by mouth daily. 30 tablet 11  . pantoprazole (PROTONIX) 20 MG tablet Take 20 mg by mouth daily.     . quinapril (ACCUPRIL) 40 MG tablet Take 40 mg by mouth daily.     . sertraline (ZOLOFT) 50 MG tablet Take 50 mg by mouth daily.     Marland Kitchen glucose blood (PRECISION QID TEST) test strip Use 1 each 2 (two) times daily. Use as instructed.      Results for orders placed or performed during the hospital encounter of 06/08/19 (from the past 48 hour(s))  Glucose, capillary     Status: None   Collection Time: 06/08/19  6:05 PM  Result Value Ref Range   Glucose-Capillary 90 70 - 99 mg/dL   Ct Head Wo Contrast  Result Date: 06/08/2019 CLINICAL DATA:  Dizziness and bilateral leg weakness. EXAM: CT HEAD WITHOUT CONTRAST TECHNIQUE: Contiguous axial images were obtained from the base of the skull through the vertex without intravenous contrast. COMPARISON:  May 07, 2015 FINDINGS: Brain: There is a large right supratentorial subdural hematoma measuring up to 3 cm in thickness. Mixed density blood products within the collection suggest acute versus acute on subacute hemorrhage. There is underlying significant brain parenchymal edema, as evident by effacement of the sulci  of the right frontal and parietal lobes. The right lateral ventricle is compressed. There is a 8 mm leftward midline shift. No definite evidence of large volume infarction. Vascular: No hyperdense vessel or unexpected calcification. Skull: Normal. Negative for fracture or focal lesion. Sinuses/Orbits: No acute finding. Other: None. IMPRESSION: 1. Large acute right supratentorial subdural hematoma measuring up to 3 cm in thickness, with significant underlying brain parenchymal edema. 2. Resultant compression of the right lateral ventricle and 8 mm leftward midline shift. Critical Value/emergent results were called by telephone at the time of interpretation on 06/08/2019  at 12:07 pm to Dr. [place] Ormond , who verbally acknowledged these results. Electronically Signed   By: Fidela [place] M.D.   On: 06/08/2019 12:11    Review of Systems  Constitutional: Negative.   HENT: Negative.   Eyes: Negative.   Respiratory: Negative.   Cardiovascular:       History of hypertension  Gastrointestinal: Negative.   Genitourinary: Negative.   Musculoskeletal: Negative.   Skin: Negative.   Neurological: Positive for sensory change, focal weakness and headaches.       Unsteadiness of gait  Endo/Heme/Allergies: Negative.   Psychiatric/Behavioral: Negative.     Blood pressure 132/73, pulse 73, temperature 98.7 F (37.1 C), temperature source Oral, resp. rate 16, height 5\' 4"  (1.626 m), weight 63.5 kg, SpO2 98 %. Physical Exam  Constitutional: He is oriented to person, place, and time. He appears well-developed and well-nourished.  HENT:  Head: Normocephalic and atraumatic.  Eyes: Pupils are equal, round, and reactive to light. Conjunctivae and EOM are normal.  Neck: Normal range of motion. Neck supple.  Cardiovascular: Normal rate and regular rhythm.  Respiratory: Effort normal and breath sounds normal.  GI: Bowel sounds are normal.  Musculoskeletal: Normal range of motion.  Neurological: He is alert and oriented to person, place, and time. He has normal reflexes.  No evidence of a cortical drift.  Finger-to-nose finger is slow bilaterally.  Gait is wide-based and unsteady.  Skin: Skin is warm and dry.  Psychiatric: He has a normal mood and affect. His behavior is normal. Judgment and thought content normal.     Assessment/Plan Right frontoparietal subdural hematoma with shift and mass-effect chronic, subacute.  Plan: Bur hole drainage of chronic subdural hematoma.  Earleen Newport, MD 06/08/2019, 7:38 PM

## 2019-06-08 NOTE — ED Triage Notes (Signed)
Pt here from [facility] for evaluation of subdural hematoma with 90mm midline shift. Pt endorses multiple falls and dizziness over the last few weeks.

## 2019-06-08 NOTE — ED Provider Notes (Signed)
[facility] EMERGENCY DEPARTMENT Provider Note   CSN: [id number] Arrival date & time: 06/08/19  1512     History   Chief Complaint Chief Complaint  Patient presents with  . Weakness    HPI Russell Holmes is a 78 y.o. male with a past medical history of hypertension who presents to the emergency department as a transfer with concern for subdural hematoma. Patient and family report patient has been having more recent falls. Most recent fall 4 days ago. Patient denies hitting his head or loosing consciousness. Patient reports being on daily baby ASA but denies other blood thinners. Patient denies confusion, nausea, vomiting, or vision changes. Patient repots a mild bi-temporal headache that has been intermittent the last several days. Family reports patient has been more unsteady on his feet the last week and that he walks without assistance at baseline. Patient was seen at an OSH and transferred due to R SDH with midline shift found on CT of the head.     The history is provided by the patient and the spouse.    History reviewed. No pertinent past medical history.  Patient Active Problem List   Diagnosis Date Noted  . Subdural hematoma (Hardy) 06/08/2019  . 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 BREAST LUMPECTOMY    . HERNIA REPAIR          Home Medications    Prior to Admission medications   Medication Sig Start Date End Date Taking? Authorizing Provider  amLODipine (NORVASC) 5 MG tablet Take 5 mg by mouth daily.  07/27/18  Yes [provider]  aspirin EC 81 MG tablet Take by mouth.   Yes [provider]  brimonidine (ALPHAGAN) 0.2 % ophthalmic solution Place 1 drop into both eyes 2 (two) times daily.    Yes [provider]  brimonidine-timolol (COMBIGAN) 0.2-0.5 % ophthalmic solution Place 1 drop into both eyes every 12 (twelve) hours.    Yes [provider]   clopidogrel (PLAVIX) 75 MG tablet Take 75 mg by mouth daily. 04/18/19  Yes [provider]  latanoprost (XALATAN) 0.005 % ophthalmic solution Place 1 drop into both eyes at bedtime.   Yes [provider]  lovastatin (MEVACOR) 40 MG tablet Take 40 mg by mouth daily.  06/21/18  Yes [provider]  metFORMIN (GLUCOPHAGE) 500 MG tablet Take 1,000 mg by mouth daily with breakfast.  07/18/18  Yes [provider]  oxybutynin (DITROPAN-XL) 10 MG 24 hr tablet Take 1 tablet (10 mg total) by mouth daily. 10/11/18  Yes Billey Co, MD  pantoprazole (PROTONIX) 20 MG tablet Take 20 mg by mouth daily.  08/22/18 08/22/19 Yes [provider]  quinapril (ACCUPRIL) 40 MG tablet Take 40 mg by mouth daily.  07/18/18  Yes [provider]  sertraline (ZOLOFT) 50 MG tablet Take 50 mg by mouth daily.  07/22/18  Yes [provider]  glucose blood (PRECISION QID TEST) test strip Use 1 each 2 (two) times daily. Use as instructed.    [provider]    Family History Family History  Problem Relation Age of Onset  . Prostate cancer Neg Hx   . Bladder Cancer Neg Hx   . Kidney cancer Neg Hx     Social History Social History   Tobacco Use  . Smoking status: Never Smoker  . Smokeless tobacco: Never Used  Substance Use Topics  . Alcohol use: Yes  Alcohol/week: 3.0 standard drinks    Types: 3 Cans of beer per week  . Drug use: Not on file     Allergies   Patient has no known allergies.   Review of Systems Review of Systems  Constitutional: Negative for fever.  HENT: Negative for trouble swallowing.   Eyes: Negative for visual disturbance.  Respiratory: Negative for cough and shortness of breath.   Cardiovascular: Negative for chest pain and palpitations.  Gastrointestinal: Negative for abdominal pain, constipation, diarrhea, nausea and vomiting.  Genitourinary: Negative for difficulty urinating.  Musculoskeletal: Positive for gait  problem.  Neurological: Positive for headaches. Negative for seizures, facial asymmetry, speech difficulty, weakness and numbness.  Psychiatric/Behavioral: Negative for confusion.     Physical Exam Updated Vital Signs BP 136/78 (BP Location: Right Arm)   Pulse 83   Temp 97.8 F (36.6 C) (Oral)   Resp 18   Ht 5\' 4"  (1.626 m)   Wt 63.5 kg   SpO2 97%   BMI 24.03 kg/m   Physical Exam Constitutional:      General: He is not in acute distress. HENT:     Head: Normocephalic and atraumatic.     Right Ear: External ear normal.     Left Ear: External ear normal.     Nose: Nose normal.     Mouth/Throat:     Mouth: Mucous membranes are moist.     Pharynx: Oropharynx is clear.  Eyes:     Extraocular Movements: Extraocular movements intact.     Pupils: Pupils are equal, round, and reactive to light.  Neck:     Musculoskeletal: Neck supple.  Cardiovascular:     Rate and Rhythm: Normal rate and regular rhythm.     Pulses: Normal pulses.  Pulmonary:     Effort: Pulmonary effort is normal.     Breath sounds: No wheezing, rhonchi or rales.  Chest:     Chest wall: No tenderness.  Abdominal:     Palpations: Abdomen is soft.     Tenderness: There is no abdominal tenderness. There is no guarding.  Musculoskeletal:     Right lower leg: No edema.     Left lower leg: No edema.  Skin:    General: Skin is warm and dry.     Findings: Bruising (L chest) present.  Neurological:     General: No focal deficit present.     Mental Status: He is alert.     GCS: GCS eye subscore is 4. GCS verbal subscore is 5. GCS motor subscore is 6.     Cranial Nerves: No cranial nerve deficit.     Sensory: No sensory deficit.     Motor: No weakness or pronator drift.     Coordination: Coordination normal. Finger-Nose-Finger Test normal.     Comments: No clonus bilaterally. Gait not assessed.      ED Treatments / Results  Labs (all labs ordered are listed, but only abnormal results are displayed)  Labs Reviewed  GLUCOSE, CAPILLARY - Abnormal; Notable for the following components:      Result Value   Glucose-Capillary 116 (*)    All other components within normal limits  MRSA PCR SCREENING  GLUCOSE, CAPILLARY  HEMOGLOBIN A1C    EKG None  Radiology Ct Head Wo Contrast  Result Date: 06/08/2019 CLINICAL DATA:  Dizziness and bilateral leg weakness. EXAM: CT HEAD WITHOUT CONTRAST TECHNIQUE: Contiguous axial images were obtained from the base of the skull through the vertex without intravenous contrast. COMPARISON:  May 07, 2015 FINDINGS: Brain: There is a large right supratentorial subdural hematoma measuring up to 3 cm in thickness. Mixed density blood products within the collection suggest acute versus acute on subacute hemorrhage. There is underlying significant brain parenchymal edema, as evident by effacement of the sulci of the right frontal and parietal lobes. The right lateral ventricle is compressed. There is a 8 mm leftward midline shift. No definite evidence of large volume infarction. Vascular: No hyperdense vessel or unexpected calcification. Skull: Normal. Negative for fracture or focal lesion. Sinuses/Orbits: No acute finding. Other: None. IMPRESSION: 1. Large acute right supratentorial subdural hematoma measuring up to 3 cm in thickness, with significant underlying brain parenchymal edema. 2. Resultant compression of the right lateral ventricle and 8 mm leftward midline shift. Critical Value/emergent results were called by telephone at the time of interpretation on 06/08/2019 at 12:07 pm to Dr. Grayland Ormond , who verbally acknowledged these results. Electronically Signed   By: Fidela Salisbury M.D.   On: 06/08/2019 12:11    Procedures Procedures (including critical care time)  Medications Ordered in ED Medications  0.9 %  sodium chloride infusion ( Intravenous Anesthesia Volume Adjustment 06/08/19 2026)  amLODipine (NORVASC) tablet 5 mg (has no administration in time  range)  brimonidine (ALPHAGAN) 0.2 % ophthalmic solution 1 drop (1 drop Both Eyes Not Given 06/08/19 2306)  latanoprost (XALATAN) 0.005 % ophthalmic solution 1 drop (has no administration in time range)  pravastatin (PRAVACHOL) tablet 40 mg (has no administration in time range)  metFORMIN (GLUCOPHAGE) tablet 1,000 mg (has no administration in time range)  oxybutynin (DITROPAN-XL) 24 hr tablet 10 mg (has no administration in time range)  pantoprazole (PROTONIX) EC tablet 20 mg (has no administration in time range)  quinapril (ACCUPRIL) tablet 40 mg (has no administration in time range)  sertraline (ZOLOFT) tablet 50 mg (has no administration in time range)  docusate sodium (COLACE) capsule 100 mg (has no administration in time range)  senna (SENOKOT) tablet 8.6 mg (8.6 mg Oral Not Given 06/08/19 2305)  polyethylene glycol (MIRALAX / GLYCOLAX) packet 17 g (has no administration in time range)  bisacodyl (DULCOLAX) suppository 10 mg (has no administration in time range)  sodium phosphate (FLEET) 7-19 GM/118ML enema 1 enema (has no administration in time range)  ondansetron (ZOFRAN) tablet 4 mg (has no administration in time range)    Or  ondansetron (ZOFRAN) injection 4 mg (has no administration in time range)  promethazine (PHENERGAN) tablet 12.5-25 mg (has no administration in time range)  labetalol (NORMODYNE) injection 10-40 mg (has no administration in time range)  levETIRAcetam (KEPPRA) IVPB 500 mg/100 mL premix (500 mg Intravenous New Bag/Given 06/08/19 2337)  pantoprazole (PROTONIX) injection 40 mg (has no administration in time range)  insulin aspart (novoLOG) injection 0-15 Units (has no administration in time range)  HYDROcodone-acetaminophen (NORCO/VICODIN) 5-325 MG per tablet 1 tablet (has no administration in time range)  Chlorhexidine Gluconate Cloth 2 % PADS 6 each (has no administration in time range)  timolol (TIMOPTIC) 0.5 % ophthalmic solution 1 drop (has no administration in time  range)  ceFAZolin (ANCEF) 2-4 [id number] IVPB (  Duplicate [id number])  lidocaine-EPINEPHrine (XYLOCAINE W/EPI) 1 %-1:100000 (with pres) injection (has no administration in time range)  povidone-iodine (BETADINE) 10 % ointment (has no administration in time range)  vancomycin (VANCOCIN) 1000 MG powder (has no administration in time range)  bupivacaine (PF) (MARCAINE) 0.25 % injection (has no administration in time range)  thrombin 20000 units spray (has no administration in time  range)  vancomycin (VANCOCIN) 1000 MG powder (has no administration in time range)  thrombin 5000 units spray (has no administration in time range)  lidocaine 20 MG/ML injection (has no administration in time range)  rocuronium bromide 100 MG/10ML SOSY (has no administration in time range)  ondansetron (ZOFRAN) 4 MG/2ML injection (has no administration in time range)  propofol (DIPRIVAN) 10 mg/mL bolus/IV push (has no administration in time range)  fentaNYL (SUBLIMAZE) 250 MCG/5ML injection (has no administration in time range)  fentaNYL (SUBLIMAZE) 250 MCG/5ML injection (has no administration in time range)  lidocaine-EPINEPHrine (XYLOCAINE W/EPI) 1 %-1:100000 (with pres) injection (has no administration in time range)  bupivacaine (PF) (MARCAINE) 0.25 % injection (has no administration in time range)     Initial Impression / Assessment and Plan / ED Course  I have reviewed the triage vital signs and the nursing notes.  Pertinent labs & imaging results that were available during my care of the patient were reviewed by me and considered in my medical decision making (see chart for details).        Concern for SDH with midline shift on CT of head at OSH. Patient has a non-focal neurologic exam and is protecting his airway. Denies nausea/vomiting or vision changes. Labs at OSH un-revealing.   Neurosurgery was consulted who advised admission to their service. Patient was admitted to the neurosurgical service for  further evaluation and management of his SDH.  Patient seen and plan discussed with Dr. Ashok Cordia.  Final Clinical Impressions(s) / ED Diagnoses   Final diagnoses:  Acute subdural hematoma [facility])    ED Discharge Orders    None       Betsey Amen, MD 06/09/19 [id number]    Lajean Saver, MD 06/12/19 [phone]

## 2019-06-08 NOTE — Anesthesia Postprocedure Evaluation (Signed)
Anesthesia Post Note  Patient: Russell Holmes  Procedure(s) Performed: Haskell Flirt (Right Head)     Patient location during evaluation: PACU Anesthesia Type: General Level of consciousness: awake Pain management: pain level controlled Vital Signs Assessment: post-procedure vital signs reviewed and stable Respiratory status: spontaneous breathing Cardiovascular status: stable Postop Assessment: no headache Anesthetic complications: no    Last Vitals:  Vitals:   06/08/19 1730 06/08/19 2143  BP: 132/73 136/71  Pulse: 73 84  Resp:  15  Temp:  36.6 C  SpO2: 98% 98%    Last Pain:  Vitals:   06/08/19 2143  TempSrc:   PainSc: 0-No pain    LLE Motor Response: Purposeful movement;Responds to commands (06/08/19 2143) LLE Sensation: Full sensation (06/08/19 2143) RLE Motor Response: Purposeful movement;Responds to commands (06/08/19 2143) RLE Sensation: Full sensation (06/08/19 2143)      Dulcy Sida

## 2019-06-08 NOTE — ED Notes (Signed)
EDP Monks to bedside. Family member with pt at bedside. Pt denies fall in which he hit his head but family states pt has fallen several times and the fall about a month ago pt hit his head.

## 2019-06-08 NOTE — Transfer of Care (Signed)
Immediate Anesthesia Transfer of Care Note  Patient: Russell Holmes  Procedure(s) Performed: Haskell Flirt (Right Head)  Patient Location: PACU  Anesthesia Type:General  Level of Consciousness: awake, alert , oriented and patient cooperative  Airway & Oxygen Therapy: Patient Spontanous Breathing and Patient connected to nasal cannula oxygen  Post-op Assessment: Report given to RN and Post -op Vital signs reviewed and stable  Post vital signs: Reviewed and stable  Last Vitals:  Vitals Value Taken Time  BP 136/71 06/08/19 2137  Temp    Pulse 84 06/08/19 2142  Resp 15 06/08/19 2142  SpO2 98 % 06/08/19 2142  Vitals shown include unvalidated device data.  Last Pain:  Vitals:   06/08/19 1521  TempSrc:   PainSc: 0-No pain         Complications: No apparent anesthesia complications

## 2019-06-08 NOTE — ED Triage Notes (Addendum)
PT sent over from outpt CT scan for abd scan that showed subdur hematoma . PT initial complain was dizziness and has had multiple falls last week. Denies any anticoag use. PT spouse says no changes in orientation. Pt is A&O ,

## 2019-06-08 NOTE — Anesthesia Procedure Notes (Signed)
Procedure Name: Intubation Date/Time: 06/08/2019 8:41 PM Performed by: Shirlyn Goltz, CRNA Pre-anesthesia Checklist: Patient identified, Emergency Drugs available, Suction available and Patient being monitored Patient Re-evaluated:Patient Re-evaluated prior to induction Oxygen Delivery Method: Circle system utilized Preoxygenation: Pre-oxygenation with 100% oxygen Induction Type: IV induction Ventilation: Mask ventilation without difficulty Laryngoscope Size: Mac and 3 Grade View: Grade I Tube type: Oral Tube size: 7.5 mm Number of attempts: 1 Airway Equipment and Method: Stylet Placement Confirmation: ETT inserted through vocal cords under direct vision,  positive ETCO2 and breath sounds checked- equal and bilateral Secured at: 21 cm Tube secured with: Tape Dental Injury: Teeth and Oropharynx as per pre-operative assessment

## 2019-06-09 ENCOUNTER — Inpatient Hospital Stay (HOSPITAL_COMMUNITY): Payer: Medicare Other

## 2019-06-09 ENCOUNTER — Encounter (HOSPITAL_COMMUNITY): Payer: Self-pay | Admitting: Neurological Surgery

## 2019-06-09 LAB — GLUCOSE, CAPILLARY
Glucose-Capillary: 138 mg/dL — ABNORMAL HIGH (ref 70–99)
Glucose-Capillary: 259 mg/dL — ABNORMAL HIGH (ref 70–99)
Glucose-Capillary: 110 mg/dL — ABNORMAL HIGH (ref 70–99)
Glucose-Capillary: 173 mg/dL — ABNORMAL HIGH (ref 70–99)

## 2019-06-09 LAB — HEMOGLOBIN A1C
Hgb A1c MFr Bld: 6.3 % — ABNORMAL HIGH (ref 4.8–5.6)
Mean Plasma Glucose: 134.11 mg/dL

## 2019-06-09 MED ORDER — VANCOMYCIN HCL 1000 MG IV SOLR
INTRAVENOUS | Status: AC
  Filled 2019-06-09: qty 1000

## 2019-06-09 MED ORDER — THROMBIN 20000 UNITS EX SOLR
CUTANEOUS | Status: AC
  Filled 2019-06-09: qty 20000

## 2019-06-09 MED ORDER — LIDOCAINE-EPINEPHRINE 1 %-1:100000 IJ SOLN
INTRAMUSCULAR | Status: AC
  Filled 2019-06-09: qty 1

## 2019-06-09 MED ORDER — THROMBIN 5000 UNITS EX SOLR
CUTANEOUS | Status: AC
  Filled 2019-06-09: qty 5000

## 2019-06-09 NOTE — Progress Notes (Signed)
Met Russell Holmes in hospital lobby reporting her husband had surgery but no one had come to find her to tell her how he had done and where he was after Short Stay.  PACU nurse reported via VM when  Pt had been moved.  PACU nurse called unit to inquire if wife may have short visit.  Escorted Russell Holmes to [facility] for short visit and escorted her out of ED entrance near her car.

## 2019-06-09 NOTE — Progress Notes (Signed)
Met Pt wife in hospital lobby distraught because she couldn't find her husband.  Chaplain found out where Pt was and escorted wife to unit for nurse approved short visit.  Escorted wife out of hospital to find her car.  Referred to [facility] Chaplain for follow-up.

## 2019-06-09 NOTE — Progress Notes (Signed)
Patient ID: Russell Holmes, male   DOB: 1941-02-02, 78 y.o.   MRN: [id number] Vital signs are stable CT looks much improved We will leave drain in for today and possibly tomorrow depending on output continue to monitor in ICU.

## 2019-06-10 LAB — GLUCOSE, CAPILLARY
Glucose-Capillary: 117 mg/dL — ABNORMAL HIGH (ref 70–99)
Glucose-Capillary: 221 mg/dL — ABNORMAL HIGH (ref 70–99)
Glucose-Capillary: 72 mg/dL (ref 70–99)
Glucose-Capillary: 123 mg/dL — ABNORMAL HIGH (ref 70–99)

## 2019-06-10 LAB — MRSA PCR SCREENING: MRSA by PCR: NEGATIVE

## 2019-06-10 NOTE — Progress Notes (Signed)
Subjective: Patient reports Doing well minimal headache  Objective: Vital signs in last 24 hours: Temp:  [97.7 F (36.5 C)-98.4 F (36.9 C)] 98.1 F (36.7 C) (09/05 0400) Pulse Rate:  [50-94] 50 (09/05 0800) Resp:  [13-24] 13 (09/05 0800) BP: (93-139)/(43-80) 139/65 (09/05 0800) SpO2:  [92 %-100 %] 100 % (09/05 0800)  Intake/Output from previous day: 09/04 0701 - 09/05 0700 In: -  Out: 200 [Urine:200] Intake/Output this shift: No intake/output data recorded.  Awake and alert moves all extremities no pronator drift  Lab Results: Recent Labs    06/08/19 1258  WBC 6.5  HGB 13.0  HCT 38.3*  PLT 230   BMET Recent Labs    06/08/19 1258  NA 142  K 3.6  CL 107  CO2 25  GLUCOSE 98  BUN 20  CREATININE 0.70  CALCIUM 9.9    Studies/Results: Ct Head Wo Contrast  Result Date: 06/09/2019 CLINICAL DATA:  Follow-up examination for head trauma. EXAM: CT HEAD WITHOUT CONTRAST TECHNIQUE: Contiguous axial images were obtained from the base of the skull through the vertex without intravenous contrast. COMPARISON:  Prior CT from 06/08/2019. FINDINGS: Brain: Postoperative changes from prior right frontal and parietal burr hole craniotomy for subdural evacuation. Subdural drain remains in place extending via the right frontal burr hole, tip overlying the right temporal convexity. Postoperative pneumocephalus present within the right extra-axial space. The mixed density right subdural hematoma has been partially evacuated, now decreased in size and measuring up to 14 mm in maximal diameter at the right frontal convexity. Improved right-to-left midline shift now measuring 5 mm. No complication. No other new acute intracranial hemorrhage. No acute large vessel territory infarct. Atrophy with chronic microvascular ischemic disease noted. 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 Vascular: No hyperdense vessel. Calcified atherosclerosis at the skull base. Skull: Post craniotomy  changes at the right scalp. Skin staples in place. Sinuses/Orbits: Globes and orbital soft tissues demonstrate no acute finding. Paranasal sinuses and mastoids remain clear. Other: None. IMPRESSION: 1. Postoperative changes from interval right burr hole craniotomy for subdural evacuation, subdural drain remains in place. 2. Interval decrease in size of mixed density right subdural collection, now measuring up to 14 mm in maximal thickness. Improved right-to-left midline shift now measuring 5 mm. 3. Otherwise stable head CT, with no other new acute intracranial abnormality. Electronically Signed   By: Jeannine Boga M.D.   On: 06/09/2019 03:59   Ct Head Wo Contrast  Result Date: 06/08/2019 CLINICAL DATA:  Dizziness and bilateral leg weakness. EXAM: CT HEAD WITHOUT CONTRAST TECHNIQUE: Contiguous axial images were obtained from the base of the skull through the vertex without intravenous contrast. COMPARISON:  May 07, 2015 FINDINGS: Brain: There is a large right supratentorial subdural hematoma measuring up to 3 cm in thickness. Mixed density blood products within the collection suggest acute versus acute on subacute hemorrhage. There is underlying significant brain parenchymal edema, as evident by effacement of the sulci of the right frontal and parietal lobes. The right lateral ventricle is compressed. There is a 8 mm leftward midline shift. No definite evidence of large volume infarction. Vascular: No hyperdense vessel or unexpected calcification. Skull: Normal. Negative for fracture or focal lesion. Sinuses/Orbits: No acute finding. Other: None. IMPRESSION: 1. Large acute right supratentorial subdural hematoma measuring up to 3 cm in thickness, with significant underlying brain parenchymal edema. 2. Resultant compression of the right lateral ventricle and 8 mm leftward midline shift. Critical Value/emergent results were called by telephone at the time  of interpretation on 06/08/2019 at 12:07 pm to Dr.  Grayland Ormond , who verbally acknowledged these results. Electronically Signed   By: Fidela Salisbury M.D.   On: 06/08/2019 12:11    Assessment/Plan: Postop day 2 bur hole craniectomy for chronic subdural subdural drain in place still with reasonable output CT scan looks good although still with moderate amount of pneumocephalus.  Continue subdural drain 1 more day  LOS: 2 days     Ruthell Feigenbaum P 06/10/2019, 8:28 AM

## 2019-06-10 NOTE — Evaluation (Signed)
Physical Therapy Evaluation Patient Details Name: Russell Holmes MRN: [id number] DOB: [birth year] Today's Date: 06/10/2019   History of Present Illness  Russell Holmes is a 78 year old individual whose had some deterioration in function noticed by his family with unsteadiness of gait forgetfulness and easy falling.  A CT scan was done in [place] today and this demonstrated presence of a subdural hematoma that was large chronic with shift and mass-effect from the right to the left he is now s/p surgical bur holes.  Clinical Impression  Orders received for PT evaluation. Patient demonstrates deficits in functional mobility as indicated below. Will benefit from continued skilled PT to address deficits and maximize function. Will see as indicated and progress as tolerated.  At this time, anticipate patient will continue to progress well with balance and mobility. Do not foresee post acute needs based on current performance.     Follow Up Recommendations No PT follow up;Supervision for mobility/OOB    Equipment Recommendations  None recommended by PT    Recommendations for Other Services       Precautions / Restrictions Precautions Precautions: Fall Precaution Comments: jp drain      Mobility  Bed Mobility               General bed mobility comments: recieved OOB in chair  Transfers Overall transfer level: Needs assistance Equipment used: 1 person hand held assist Transfers: Sit to/from Stand Sit to Stand: Min guard         General transfer comment: min guard for safety, no physical assist required  Ambulation/Gait Ambulation/Gait assistance: Min guard;Min assist Gait Distance (Feet): 380 Feet Assistive device: None Gait Pattern/deviations: Step-through pattern;Decreased stride length;Shuffle Gait velocity: decreased Gait velocity interpretation: <1.8 ft/sec, indicate of risk for recurrent falls General Gait Details: patient with some noted instability, min guard  to min assist with modest balance checks  Stairs            Wheelchair Mobility    Modified Rankin (Stroke Patients Only) Modified Rankin (Stroke Patients Only) Pre-Morbid Rankin Score: No symptoms Modified Rankin: Moderately severe disability     Balance Overall balance assessment: Needs assistance;History of Falls                           High level balance activites: Side stepping;Backward walking;Direction changes;Turns;Sudden stops;Head turns High Level Balance Comments: min assist for safety and stability Standardized Balance Assessment Standardized Balance Assessment : Dynamic Gait Index   Dynamic Gait Index Level Surface: Mild Impairment Change in Gait Speed: Mild Impairment Gait with Horizontal Head Turns: Mild Impairment Gait with Vertical Head Turns: Mild Impairment Gait and Pivot Turn: Moderate Impairment       Pertinent Vitals/Pain Pain Assessment: Faces Faces Pain Scale: Hurts a little bit Pain Location: headache Pain Descriptors / Indicators: Headache Pain Intervention(s): Monitored during session    Home Living Family/patient expects to be discharged to:: Private residence Living Arrangements: Spouse/significant other Available Help at Discharge: Family Type of Home: House Home Access: Level entry     Home Layout: One level Home Equipment: None      Prior Function Level of Independence: Independent               Hand Dominance   Dominant Hand: Right    Extremity/Trunk Assessment   Upper Extremity Assessment Upper Extremity Assessment: Overall WFL for tasks assessed    Lower Extremity Assessment Lower Extremity Assessment: Overall WFL for tasks assessed  Communication   Communication: No difficulties  Cognition Arousal/Alertness: Awake/alert Behavior During Therapy: WFL for tasks assessed/performed Overall Cognitive Status: Within Functional Limits for tasks assessed                                         General Comments      Exercises     Assessment/Plan    PT Assessment Patient needs continued PT services  PT Problem List Decreased balance;Decreased mobility;Decreased safety awareness;Pain       PT Treatment Interventions DME instruction;Gait training;Stair training;Functional mobility training;Therapeutic activities;Therapeutic exercise;Balance training;Patient/family education;Neuromuscular re-education;Cognitive remediation    PT Goals (Current goals can be found in the Care Plan section)  Acute Rehab PT Goals Patient Stated Goal: to go home PT Goal Formulation: With patient Time For Goal Achievement: 06/24/19 Potential to Achieve Goals: Good    Frequency Min 3X/week   Barriers to discharge        Co-evaluation               AM-PAC PT "6 Clicks" Mobility  Outcome Measure Help needed turning from your back to your side while in a flat bed without using bedrails?: None Help needed moving from lying on your back to sitting on the side of a flat bed without using bedrails?: None Help needed moving to and from a bed to a chair (including a wheelchair)?: None Help needed standing up from a chair using your arms (e.g., wheelchair or bedside chair)?: A Little Help needed to walk in hospital room?: A Little Help needed climbing 3-5 steps with a railing? : A Little 6 Click Score: 21    End of Session Equipment Utilized During Treatment: Gait belt Activity Tolerance: Patient tolerated treatment well Patient left: in chair;with call bell/phone within reach;with chair alarm set Nurse Communication: Mobility status PT Visit Diagnosis: Other abnormalities of gait and mobility (R26.89);Difficulty in walking, not elsewhere classified (R26.2);Other symptoms and signs involving the nervous system (R29.898)    Time: [id number] PT Time Calculation (min) (ACUTE ONLY): 24 min   Charges:   PT Evaluation $PT Eval Moderate Complexity: 1 Mod PT  Treatments $Gait Training: 8-22 mins        Alben Deeds, PT DPT  Board Certified Neurologic Specialist [facility] Pager [phone] Office [place] 06/10/2019, 1:37 PM

## 2019-06-11 LAB — GLUCOSE, CAPILLARY
Glucose-Capillary: 134 mg/dL — ABNORMAL HIGH (ref 70–99)
Glucose-Capillary: 227 mg/dL — ABNORMAL HIGH (ref 70–99)
Glucose-Capillary: 138 mg/dL — ABNORMAL HIGH (ref 70–99)
Glucose-Capillary: 125 mg/dL — ABNORMAL HIGH (ref 70–99)

## 2019-06-11 MED ORDER — LEVETIRACETAM 500 MG PO TABS
500.0000 mg | ORAL_TABLET | Freq: Two times a day (BID) | ORAL | Status: DC
  Administered 2019-06-11 – 2019-06-12 (×3): 500 mg via ORAL
  Filled 2019-06-11 (×3): qty 1

## 2019-06-11 NOTE — Progress Notes (Signed)
Subjective: The patient is alert and pleasant.  He has no complaints.  He looks well.  Objective: Vital signs in last 24 hours: Temp:  [36.9 C-37.2 C] 37.2 C (09/06 0400) Pulse Rate:  [44-97] 44 (09/06 0800) Resp:  [11-26] 15 (09/06 0800) BP: (93-150)/(50-107) 137/65 (09/06 0800) SpO2:  [94 %-100 %] 97 % (09/06 0800) Estimated body mass index is 24.03 kg/m as calculated from the following:   Height as of this encounter: 5\' 4"  (1.626 m).   Weight as of this encounter: 63.5 kg.   Intake/Output from previous day: 09/05 0701 - 09/06 0700 In: 417.1 [I.V.:213.3; IV Piggyback:203.8] Out: 1300 [Urine:1275; Drains:25] Intake/Output this shift: No intake/output data recorded.  Physical exam the patient is alert and oriented x3.  He is moving all 4 extremities well.  His speech is normal.  The patient's subdural drain has been draining minimal amounts.  I removed it with the patient's consent.  Lab Results: Recent Labs    06/08/19 1258  WBC 6.5  HGB 13.0  HCT 38.3*  PLT 230   BMET Recent Labs    06/08/19 1258  NA 142  K 3.6  CL 107  CO2 25  GLUCOSE 98  BUN 20  CREATININE 0.70  CALCIUM 9.9    Studies/Results: No results found.  Assessment/Plan: Subdural hematoma: The patient is doing well.  He is okay to transfer to the floor.  LOS: 3 days     Ophelia Charter 06/11/2019, 8:51 AM

## 2019-06-11 NOTE — Evaluation (Signed)
Occupational Therapy Evaluation Patient Details Name: Russell Holmes MRN: [id number] DOB: [birth year] Today's Date: 06/11/2019    History of Present Illness Russell Holmes is a 78 year old individual whose had some deterioration in function noticed by his family with unsteadiness of gait forgetfulness and easy falling.  A CT scan was done in [place] today and this demonstrated presence of a subdural hematoma that was large chronic with shift and mass-effect from the right to the left he is now s/p surgical bur holes.   Clinical Impression   PTA patient independent. Admitted for above and limited by problem list below, including impaired balance, decreased activity tolerance and slight STM deficits.  He demonstrates ability to complete transfers with min guard, mobility with min guard, LB ADLs with min guard.  Cognitively, oriented and pleasant, completing short blessed test scoring normal with only noted deficits in STM.  He will benefit from acute OT services in order to maximize independence and safety with ADLs/ mobility in order to return to PLOF independent level.  Anticipate no further needs after dc.     Follow Up Recommendations  No OT follow up    Equipment Recommendations  None recommended by OT    Recommendations for Other Services       Precautions / Restrictions Precautions Precautions: Fall Restrictions Weight Bearing Restrictions: No      Mobility Bed Mobility               General bed mobility comments: recieved OOB in chair  Transfers Overall transfer level: Needs assistance Equipment used: None Transfers: Sit to/from Stand Sit to Stand: Min guard         General transfer comment: min guard for safety, no physical assist required    Balance Overall balance assessment: Needs assistance;History of Falls Sitting-balance support: No upper extremity supported;Feet supported Sitting balance-Leahy Scale: Good     Standing balance support: No upper  extremity supported;During functional activity Standing balance-Leahy Scale: Fair Standing balance comment: min guard dynamically                           ADL either performed or assessed with clinical judgement   ADL Overall ADL's : Needs assistance/impaired     Grooming: Min guard;Wash/dry hands;Standing   Upper Body Bathing: Set up;Sitting   Lower Body Bathing: Min guard;Sit to/from stand   Upper Body Dressing : Sitting;Set up   Lower Body Dressing: Min guard;Sit to/from stand Lower Body Dressing Details (indicate cue type and reason): able to don/doff socks with ease, min guard sit to stand  Toilet Transfer: Min guard;Ambulation Toilet Transfer Details (indicate cue type and reason): simulated in room         Functional mobility during ADLs: Min guard       Vision Baseline Vision/History: Wears glasses Wears Glasses: Reading only Patient Visual Report: No change from baseline Vision Assessment?: No apparent visual deficits Additional Comments: appears [facility]      Perception     Praxis      Pertinent Vitals/Pain Pain Assessment: No/denies pain     Hand Dominance Right   Extremity/Trunk Assessment Upper Extremity Assessment Upper Extremity Assessment: Overall WFL for tasks assessed   Lower Extremity Assessment Lower Extremity Assessment: Defer to PT evaluation   Cervical / Trunk Assessment Cervical / Trunk Assessment: Normal   Communication Communication Communication: No difficulties   Cognition Arousal/Alertness: Awake/alert Behavior During Therapy: WFL for tasks assessed/performed Overall Cognitive Status: Within Functional Limits for tasks  assessed                                 General Comments: Short blessed test: 2/28= normal (only deficit in [name] recall of address)   General Comments       Exercises     Shoulder Instructions      Home Living Family/patient expects to be discharged to:: Private  residence Living Arrangements: Spouse/significant other Available Help at Discharge: Family Type of Home: House Home Access: Level entry     Home Layout: One level     Bathroom Shower/Tub: Teacher, early years/pre: Standard     Home Equipment: None          Prior Functioning/Environment Level of Independence: Independent        Comments: ADLs, IADLs, driving        OT Problem List: Impaired balance (sitting and/or standing);Decreased activity tolerance      OT Treatment/Interventions: Self-care/ADL training;Balance training;Therapeutic activities;Cognitive remediation/compensation    OT Goals(Current goals can be found in the care plan section) Acute Rehab OT Goals Patient Stated Goal: to go home OT Goal Formulation: With patient Time For Goal Achievement: 06/25/19 Potential to Achieve Goals: Good  OT Frequency: Min 2X/week   Barriers to D/C:            Co-evaluation              AM-PAC OT "6 Clicks" Daily Activity     Outcome Measure Help from another person eating meals?: None Help from another person taking care of personal grooming?: A Little Help from another person toileting, which includes using toliet, bedpan, or urinal?: A Little Help from another person bathing (including washing, rinsing, drying)?: A Little Help from another person to put on and taking off regular upper body clothing?: None Help from another person to put on and taking off regular lower body clothing?: A Little 6 Click Score: 20   End of Session Nurse Communication: Mobility status  Activity Tolerance: Patient tolerated treatment well Patient left: in chair;with call bell/phone within reach  OT Visit Diagnosis: Unsteadiness on feet (R26.81)                Time: [id number] OT Time Calculation (min): 15 min Charges:  OT General Charges $OT Visit: 1 Visit OT Evaluation $OT Eval Moderate Complexity: 1 Mod  Russell Holmes, OT Acute Rehabilitation  Services Pager [phone] Office [phone]   Russell Holmes 06/11/2019, 12:25 PM

## 2019-06-12 LAB — GLUCOSE, CAPILLARY: Glucose-Capillary: 108 mg/dL — ABNORMAL HIGH (ref 70–99)

## 2019-06-12 MED ORDER — LEVETIRACETAM 500 MG PO TABS: 500.0000 mg | ORAL_TABLET | Freq: Two times a day (BID) | ORAL | 0 refills | Status: DC

## 2019-06-12 NOTE — Discharge Summary (Signed)
Physician Discharge Summary  Patient ID: Russell Holmes MRN: [id number] DOB/AGE: Aug 07, 1941 78 y.o.  Admit date: 06/08/2019 Discharge date: 06/12/2019  Admission Diagnoses: Subdural hematoma  Discharge Diagnoses:  Active Problems:   Subdural hematoma (HCC)   Chronic subdural hematoma [facility])   Discharged Condition: good  Hospital Course: Patient was admitted to [facility] on 06/08/2019 for evacuation of a large right-sided subdural hematoma. He has done well postoperatively. His subdural drain was removed on 06/11/2019. He has worked with therapies. He is ready for discharge home.  Consults: rehabilitation medicine  Significant Diagnostic Studies: radiology: Ct Head Wo Contrast  Result Date: 06/09/2019 CLINICAL DATA:  Follow-up examination for head trauma. EXAM: CT HEAD WITHOUT CONTRAST TECHNIQUE: Contiguous axial images were obtained from the base of the skull through the vertex without intravenous contrast. COMPARISON:  Prior CT from 06/08/2019. FINDINGS: Brain: Postoperative changes from prior right frontal and parietal burr hole craniotomy for subdural evacuation. Subdural drain remains in place extending via the right frontal burr hole, tip overlying the right temporal convexity. Postoperative pneumocephalus present within the right extra-axial space. The mixed density right subdural hematoma has been partially evacuated, now decreased in size and measuring up to 14 mm in maximal diameter at the right frontal convexity. Improved right-to-left midline shift now measuring 5 mm. No complication. No other new acute intracranial hemorrhage. No acute large vessel territory infarct. Atrophy with chronic microvascular ischemic disease noted. 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 Vascular: No hyperdense vessel. Calcified atherosclerosis at the skull base. Skull: Post craniotomy changes at the right scalp. Skin staples in place. Sinuses/Orbits: Globes and orbital soft tissues demonstrate  no acute finding. Paranasal sinuses and mastoids remain clear. Other: None. IMPRESSION: 1. Postoperative changes from interval right burr hole craniotomy for subdural evacuation, subdural drain remains in place. 2. Interval decrease in size of mixed density right subdural collection, now measuring up to 14 mm in maximal thickness. Improved right-to-left midline shift now measuring 5 mm. 3. Otherwise stable head CT, with no other new acute intracranial abnormality. Electronically Signed   By: Jeannine Boga M.D.   On: 06/09/2019 03:59   Ct Head Wo Contrast  Result Date: 06/08/2019 CLINICAL DATA:  Dizziness and bilateral leg weakness. EXAM: CT HEAD WITHOUT CONTRAST TECHNIQUE: Contiguous axial images were obtained from the base of the skull through the vertex without intravenous contrast. COMPARISON:  May 07, 2015 FINDINGS: Brain: There is a large right supratentorial subdural hematoma measuring up to 3 cm in thickness. Mixed density blood products within the collection suggest acute versus acute on subacute hemorrhage. There is underlying significant brain parenchymal edema, as evident by effacement of the sulci of the right frontal and parietal lobes. The right lateral ventricle is compressed. There is a 8 mm leftward midline shift. No definite evidence of large volume infarction. Vascular: No hyperdense vessel or unexpected calcification. Skull: Normal. Negative for fracture or focal lesion. Sinuses/Orbits: No acute finding. Other: None. IMPRESSION: 1. Large acute right supratentorial subdural hematoma measuring up to 3 cm in thickness, with significant underlying brain parenchymal edema. 2. Resultant compression of the right lateral ventricle and 8 mm leftward midline shift. Critical Value/emergent results were called by telephone at the time of interpretation on 06/08/2019 at 12:07 pm to Dr. Grayland Ormond , who verbally acknowledged these results. Electronically Signed   By: Fidela Salisbury M.D.    On: 06/08/2019 12:11     Treatments: surgery: Right frontal and parietal burr holes for drainage of chronic subdural hematoma  Discharge Exam: Blood pressure (!) 147/75, pulse (!) 58, temperature 98.2 F (36.8 C), temperature source Oral, resp. rate 18, height 5\' 4"  (1.626 m), weight 63.5 kg, SpO2 100 %.  Alert and oriented x 4 PERRLA Speech fluent MAE, Strength 5/5 BUE, BLE Incision clean, dry, intact; closed with staples  Disposition: Discharge disposition: 01-Home or Self Care        Allergies as of 06/12/2019   No Known Allergies     Medication List    STOP taking these medications   clopidogrel 75 MG tablet Commonly known as: PLAVIX     TAKE these medications   amLODipine 5 MG tablet Commonly known as: NORVASC Take 5 mg by mouth daily.   aspirin EC 81 MG tablet Take by mouth.   brimonidine 0.2 % ophthalmic solution Commonly known as: ALPHAGAN Place 1 drop into both eyes 2 (two) times daily.   latanoprost 0.005 % ophthalmic solution Commonly known as: XALATAN Place 1 drop into both eyes at bedtime.   levETIRAcetam 500 MG tablet Commonly known as: KEPPRA Take 1 tablet (500 mg total) by mouth 2 (two) times daily.   lovastatin 40 MG tablet Commonly known as: MEVACOR Take 40 mg by mouth daily.   metFORMIN 500 MG tablet Commonly known as: GLUCOPHAGE Take 1,000 mg by mouth daily with breakfast.   multivitamin with minerals Tabs tablet Take 1 tablet by mouth daily. Centrum   oxybutynin 10 MG 24 hr tablet Commonly known as: DITROPAN-XL Take 1 tablet (10 mg total) by mouth daily.   pantoprazole 20 MG tablet Commonly known as: PROTONIX Take 20 mg by mouth daily.   Precision QID Test test strip Generic drug: glucose blood Use 1 each 2 (two) times daily. Use as instructed.   quinapril 40 MG tablet Commonly known as: ACCUPRIL Take 40 mg by mouth daily.   sertraline 50 MG tablet Commonly known as: ZOLOFT Take 50 mg by mouth daily.   timolol  0.5 % ophthalmic solution Commonly known as: TIMOPTIC Place 1 drop into both eyes 2 (two) times daily.      Follow-up Information    Kristeen Miss, MD. Schedule an appointment as soon as possible for a visit in 1 week(s).   Specialty: Neurosurgery Contact information: [address] [place] [address] [place] [address] [phone]           Signed: Patricia Nettle 06/12/2019, 8:43 AM

## 2019-06-12 NOTE — Progress Notes (Signed)
   Providing Compassionate, Quality Care - Together   Subjective: Patient reports no issues overnight. Patient is sitting up in the chair this morning eating breakfast.  Objective: Vital signs in last 24 hours: Temp:  [98 F (36.7 C)-98.4 F (36.9 C)] 98.2 F (36.8 C) (09/07 0751) Pulse Rate:  [55-65] 58 (09/07 0751) Resp:  [18] 18 (09/07 0324) BP: (111-147)/(70-88) 147/75 (09/07 0751) SpO2:  [99 %-100 %] 100 % (09/07 0751)  Intake/Output from previous day: 09/06 0701 - 09/07 0700 In: 200 [P.O.:200] Out: -  Intake/Output this shift: No intake/output data recorded.  Alert and oriented x 4 PERRLA Speech fluent MAE, Strength 5/5 BUE, BLE Incision clean, dry, intact; closed with staples   Assessment/Plan: Patient is four days status post burr holes for large right-sided subdural hematoma evacuation by Dr. Ellene Route.   LOS: 4 days    -Discharge home today   Viona Gilmore, DNP, AGNP-C Nurse Practitioner  [facility] [place] [address], [place] [address], [place], [place] [address] P: [phone]    F: [phone]  06/12/2019, 8:24 AM

## 2019-06-12 NOTE — Progress Notes (Signed)
Physical Therapy Treatment Patient Details Name: Russell Holmes MRN: [id number] DOB: 1941/05/16 Today's Date: 06/12/2019    History of Present Illness Russell Holmes is a 78 year old individual whose had some deterioration in function noticed by his family with unsteadiness of gait forgetfulness and easy falling.  A CT scan was done in [place] today and this demonstrated presence of a subdural hematoma that was large chronic with shift and mass-effect from the right to the left he is now s/p surgical bur holes.    PT Comments    Pt continuing to progress towards goals. Pt scored at 16 on DGI, would cont to recommend 24/7 supervision at home. Recommend pt to hold onto spouse when ascending 2 steps into home due to instability and no hand rail. Acute PT to cont to follow.    Follow Up Recommendations  No PT follow up;Supervision for mobility/OOB     Equipment Recommendations  None recommended by PT    Recommendations for Other Services       Precautions / Restrictions Precautions Precautions: Fall Restrictions Weight Bearing Restrictions: No    Mobility  Bed Mobility               General bed mobility comments: recieved sitting in chair  Transfers Overall transfer level: Needs assistance Equipment used: None Transfers: Sit to/from Stand Sit to Stand: Supervision         General transfer comment: pt steady and safe, pushed up from arm rests  Ambulation/Gait Ambulation/Gait assistance: Supervision Gait Distance (Feet): 500 Feet Assistive device: None Gait Pattern/deviations: Step-through pattern;Decreased stride length Gait velocity: decreased Gait velocity interpretation: 1.31 - 2.62 ft/sec, indicative of limited community ambulator General Gait Details: pt slow and guarded, encouraged to increase step length, no episodes of LOB   Stairs Stairs: Yes Stairs assistance: Min assist Stair Management: No rails(R HHA) Number of Stairs: 2(x2 trials) General  stair comments: didn't use hand rail as pt doesn't have one at home, pt with episode of LOB requiring assist from PT.   Wheelchair Mobility    Modified Rankin (Stroke Patients Only) Modified Rankin (Stroke Patients Only) Pre-Morbid Rankin Score: No symptoms Modified Rankin: Slight disability     Balance Overall balance assessment: Needs assistance;History of Falls Sitting-balance support: No upper extremity supported;Feet supported Sitting balance-Leahy Scale: Good     Standing balance support: No upper extremity supported;During functional activity Standing balance-Leahy Scale: Good                   Standardized Balance Assessment Standardized Balance Assessment : Dynamic Gait Index   Dynamic Gait Index Level Surface: Mild Impairment Change in Gait Speed: Mild Impairment Gait with Horizontal Head Turns: Mild Impairment Gait with Vertical Head Turns: Mild Impairment Gait and Pivot Turn: Mild Impairment Step Over Obstacle: Mild Impairment Step Around Obstacles: Mild Impairment Steps: Mild Impairment Total Score: 16      Cognition Arousal/Alertness: Awake/alert Behavior During Therapy: WFL for tasks assessed/performed Overall Cognitive Status: Within Functional Limits for tasks assessed                                        Exercises      General Comments General comments (skin integrity, edema, etc.): VSS, incisions without drainage or redness      Pertinent Vitals/Pain Pain Assessment: No/denies pain    Home Living  Prior Function            PT Goals (current goals can now be found in the care plan section) Acute Rehab PT Goals Patient Stated Goal: go home Progress towards PT goals: Progressing toward goals    Frequency    Min 3X/week      PT Plan Current plan remains appropriate    Co-evaluation              AM-PAC PT "6 Clicks" Mobility   Outcome Measure  Help needed turning  from your back to your side while in a flat bed without using bedrails?: None Help needed moving from lying on your back to sitting on the side of a flat bed without using bedrails?: None Help needed moving to and from a bed to a chair (including a wheelchair)?: None Help needed standing up from a chair using your arms (e.g., wheelchair or bedside chair)?: None Help needed to walk in hospital room?: A Little Help needed climbing 3-5 steps with a railing? : A Little 6 Click Score: 22    End of Session Equipment Utilized During Treatment: Gait belt Activity Tolerance: Patient tolerated treatment well Patient left: in chair;with call bell/phone within reach;with chair alarm set Nurse Communication: Mobility status PT Visit Diagnosis: Other abnormalities of gait and mobility (R26.89);Difficulty in walking, not elsewhere classified (R26.2);Other symptoms and signs involving the nervous system [id number])     Time: [id number] PT Time Calculation (min) (ACUTE ONLY): 15 min  Charges:  $Gait Training: 8-22 mins                     Kittie Plater, PT, DPT Acute Rehabilitation Services Pager #: [phone] Office #: [phone]    Berline Lopes 06/12/2019, 7:58 AM

## 2019-06-12 NOTE — Discharge Instructions (Signed)
Subdural Hematoma Evacuation, Care After This sheet gives you information about how to care for yourself after your procedure. Your health care provider may also give you more specific instructions. If you have problems or questions, contact your health care provider. What can I expect after the procedure? After the procedure, it is common to have:  Pain in your scalp, especially in the incision area. You will be given pain medicines to control this.  Constipation.  Headaches. Follow these instructions at home: Incision care   Follow instructions from your health care provider about how to take care of your incision. Make sure you: ? Wash your hands with soap and water before you change your bandage (dressing). If soap and water are not available, use hand sanitizer. ? Change your dressing as told by your health care provider. ? Leave stitches (sutures), skin glue, or adhesive strips in place. These skin closures may need to stay in place for 2 weeks or longer. If adhesive strip edges start to loosen and curl up, you may trim the loose edges. Do not remove adhesive strips completely unless your health care provider tells you to do that.  Check your incision area every day for signs of infection. Check for: ? More redness, swelling, or pain. ? More fluid or blood. ? Warmth. ? Pus or a bad smell. Medicines  Take over-the-counter and prescription medicines only as told by your health care provider. Do not take blood thinners or NSAIDs unless your health care provider approves. These include aspirin, ibuprofen, naproxen, and warfarin.  If you were prescribed an antibiotic medicine, use it as told by your health care provider. Do not stop using the antibiotic even if you start to feel better. Activity   Avoid any situation where there is potential for another head injury, such as football, hockey, soccer, basketball, martial arts, downhill snow sports, and horseback riding. Do not do these  activities until your health care provider approves. ? If you play a contact sport and you experience a head injury, follow advice from your health care provider about when you can return to the sport. If you get another injury while you are healing, you may experience another hemorrhage.  Avoid excessive visual stimulation while recovering. This includes working on the computer, watching TV, and reading.  Try to avoid activities that cause physical or mental stress. Stay home from work or school as directed by your health care provider.  Do not drive, ride a bicycle, or use heavy machinery until your health care provider approves.  Do not lift anything that is heavier than 5 lb (2.3 kg) until your health care provider approves.  If physical therapy was prescribed, do exercises as told by your health care provider or physical therapist.  Rest as told by your health care provider. Rest helps the brain to heal. Make sure you: ? Get plenty of sleep. Avoid staying up late at night. ? Keep a consistent sleep schedule. Try to go to sleep and wake up at about the same time every day. ? Avoid activities that cause physical or mental stress. General instructions  To prevent or treat constipation while you are taking prescription pain medicine, your health care provider may recommend that you: ? Drink enough fluid to keep your urine clear or pale yellow. ? Take over-the-counter or prescription medicines. ? Eat foods that are high in fiber, such as fresh fruits and vegetables, whole grains, and beans. ? Limit foods that are high in fat and processed  sugars, such as fried and sweet foods.  Do not take baths, swim, or use a hot tub until your health care provider approves. You may take showers as directed by your health care provider.  Limit alcohol intake to no more than 1 drink per day for nonpregnant women and 2 drinks per day for men. One drink equals 12 oz of beer, 5 oz of wine, or 1 oz of hard  liquor.  Keep all follow-up visits as told by your health care provider. This is important. Contact a health care provider if:  You have a fever.  You have pain that does not get better with medicine.  You have no appetite.  You have constipation that does not get better with stool softeners.  You have more redness, swelling, or pain around your incision.  You have more fluid or blood coming from your incision.  Your incision feels warm to the touch.  You have pus or a bad smell coming from your incision. Get help right away if:  Your incision opens.  You have severe headaches.  You develop confusion.  You have jerky movements that you cannot control (seizure).  You develop weakness or numbness on one side of your body.  You have nausea or vomiting.  You have vision problems.  You have chest pain.  You have trouble breathing.  You have pain or swelling in your calves. This information is not intended to replace advice given to you by your health care provider. Make sure you discuss any questions you have with your health care provider. Document Released: 07/12/2013 Document Revised: 09/03/2017 Document Reviewed: 08/14/2016 Elsevier Patient Education  [employer].   Subdural Hematoma Evacuation, Care After This sheet gives you information about how to care for yourself after your procedure. Your health care provider may also give you more specific instructions. If you have problems or questions, contact your health care provider. What can I expect after the procedure? After the procedure, it is common to have:  Pain in your scalp, especially in the incision area. You will be given pain medicines to control this.  Constipation.  Headaches. Follow these instructions at home: Incision care   Follow instructions from your health care provider about how to take care of your incision. Make sure you: ? Wash your hands with soap and water before you change  your bandage (dressing). If soap and water are not available, use hand sanitizer. ? Change your dressing as told by your health care provider. ? Leave stitches (sutures), skin glue, or adhesive strips in place. These skin closures may need to stay in place for 2 weeks or longer. If adhesive strip edges start to loosen and curl up, you may trim the loose edges. Do not remove adhesive strips completely unless your health care provider tells you to do that.  Check your incision area every day for signs of infection. Check for: ? More redness, swelling, or pain. ? More fluid or blood. ? Warmth. ? Pus or a bad smell. Medicines  Take over-the-counter and prescription medicines only as told by your health care provider. Do not take blood thinners or NSAIDs unless your health care provider approves. These include aspirin, ibuprofen, naproxen, and warfarin.  If you were prescribed an antibiotic medicine, use it as told by your health care provider. Do not stop using the antibiotic even if you start to feel better. Activity   Avoid any situation where there is potential for another head injury, such as  football, hockey, soccer, basketball, martial arts, downhill snow sports, and horseback riding. Do not do these activities until your health care provider approves. ? If you play a contact sport and you experience a head injury, follow advice from your health care provider about when you can return to the sport. If you get another injury while you are healing, you may experience another hemorrhage.  Avoid excessive visual stimulation while recovering. This includes working on the computer, watching TV, and reading.  Try to avoid activities that cause physical or mental stress. Stay home from work or school as directed by your health care provider.  Do not drive, ride a bicycle, or use heavy machinery until your health care provider approves.  Do not lift anything that is heavier than 5 lb (2.3 kg)  until your health care provider approves.  If physical therapy was prescribed, do exercises as told by your health care provider or physical therapist.  Rest as told by your health care provider. Rest helps the brain to heal. Make sure you: ? Get plenty of sleep. Avoid staying up late at night. ? Keep a consistent sleep schedule. Try to go to sleep and wake up at about the same time every day. ? Avoid activities that cause physical or mental stress. General instructions  To prevent or treat constipation while you are taking prescription pain medicine, your health care provider may recommend that you: ? Drink enough fluid to keep your urine clear or pale yellow. ? Take over-the-counter or prescription medicines. ? Eat foods that are high in fiber, such as fresh fruits and vegetables, whole grains, and beans. ? Limit foods that are high in fat and processed sugars, such as fried and sweet foods.  Do not take baths, swim, or use a hot tub until your health care provider approves. You may take showers as directed by your health care provider.  Limit alcohol intake to no more than 1 drink per day for nonpregnant women and 2 drinks per day for men. One drink equals 12 oz of beer, 5 oz of wine, or 1 oz of hard liquor.  Keep all follow-up visits as told by your health care provider. This is important. Contact a health care provider if:  You have a fever.  You have pain that does not get better with medicine.  You have no appetite.  You have constipation that does not get better with stool softeners.  You have more redness, swelling, or pain around your incision.  You have more fluid or blood coming from your incision.  Your incision feels warm to the touch.  You have pus or a bad smell coming from your incision. Get help right away if:  Your incision opens.  You have severe headaches.  You develop confusion.  You have jerky movements that you cannot control (seizure).  You  develop weakness or numbness on one side of your body.  You have nausea or vomiting.  You have vision problems.  You have chest pain.  You have trouble breathing.  You have pain or swelling in your calves. This information is not intended to replace advice given to you by your health care provider. Make sure you discuss any questions you have with your health care provider. Document Released: 07/12/2013 Document Revised: 09/03/2017 Document Reviewed: 08/14/2016 Elsevier Patient Education  2020 [place] will be removed at your first post operative visit. No swimming or baths until incisions are healed. It is alright to take  a shower.  Do not resume Plavix until you speak with your physician at your first post operative visit.

## 2019-06-20 ENCOUNTER — Other Ambulatory Visit: Payer: Self-pay | Admitting: Neurological Surgery

## 2019-06-20 DIAGNOSIS — S065XAA Traumatic subdural hemorrhage with loss of consciousness status unknown, initial encounter: Secondary | ICD-10-CM

## 2019-06-20 DIAGNOSIS — S065X9A Traumatic subdural hemorrhage with loss of consciousness of unspecified duration, initial encounter: Secondary | ICD-10-CM

## 2019-07-04 ENCOUNTER — Ambulatory Visit
Admission: RE | Admit: 2019-07-04 | Discharge: 2019-07-04 | Disposition: A | Discharge status: Home / Self Care | Payer: Medicare Other | Source: Ambulatory Visit | Attending: Neurological Surgery | Admitting: Neurological Surgery

## 2019-07-04 ENCOUNTER — Other Ambulatory Visit: Payer: Self-pay

## 2019-07-04 DIAGNOSIS — S065X9A Traumatic subdural hemorrhage with loss of consciousness of unspecified duration, initial encounter: Secondary | ICD-10-CM

## 2019-07-04 DIAGNOSIS — S065XAA Traumatic subdural hemorrhage with loss of consciousness status unknown, initial encounter: Secondary | ICD-10-CM

## 2019-07-10 ENCOUNTER — Other Ambulatory Visit: Payer: Self-pay | Admitting: Neurological Surgery

## 2019-07-10 DIAGNOSIS — S065X9A Traumatic subdural hemorrhage with loss of consciousness of unspecified duration, initial encounter: Secondary | ICD-10-CM

## 2019-07-10 DIAGNOSIS — S065XAA Traumatic subdural hemorrhage with loss of consciousness status unknown, initial encounter: Secondary | ICD-10-CM

## 2019-07-18 ENCOUNTER — Ambulatory Visit
Admission: RE | Admit: 2019-07-18 | Discharge: 2019-07-18 | Disposition: A | Discharge status: Home / Self Care | Payer: Medicare Other | Source: Ambulatory Visit | Attending: Neurological Surgery | Admitting: Neurological Surgery

## 2019-07-18 ENCOUNTER — Other Ambulatory Visit: Payer: Self-pay

## 2019-07-18 DIAGNOSIS — S065XAA Traumatic subdural hemorrhage with loss of consciousness status unknown, initial encounter: Secondary | ICD-10-CM

## 2019-07-18 DIAGNOSIS — S065X9A Traumatic subdural hemorrhage with loss of consciousness of unspecified duration, initial encounter: Secondary | ICD-10-CM | POA: Diagnosis not present

## 2019-07-25 ENCOUNTER — Other Ambulatory Visit: Payer: Self-pay | Admitting: Neurological Surgery

## 2019-07-25 DIAGNOSIS — S065X9A Traumatic subdural hemorrhage with loss of consciousness of unspecified duration, initial encounter: Secondary | ICD-10-CM

## 2019-07-25 DIAGNOSIS — S065XAA Traumatic subdural hemorrhage with loss of consciousness status unknown, initial encounter: Secondary | ICD-10-CM

## 2019-08-08 ENCOUNTER — Other Ambulatory Visit: Payer: Self-pay

## 2019-08-08 ENCOUNTER — Ambulatory Visit
Admission: RE | Admit: 2019-08-08 | Discharge: 2019-08-08 | Disposition: A | Discharge status: Home / Self Care | Payer: Medicare Other | Source: Ambulatory Visit | Attending: Neurological Surgery | Admitting: Neurological Surgery

## 2019-08-08 DIAGNOSIS — S065X9A Traumatic subdural hemorrhage with loss of consciousness of unspecified duration, initial encounter: Secondary | ICD-10-CM

## 2019-08-08 DIAGNOSIS — S065XAA Traumatic subdural hemorrhage with loss of consciousness status unknown, initial encounter: Secondary | ICD-10-CM

## 2019-10-16 ENCOUNTER — Other Ambulatory Visit: Payer: Self-pay | Admitting: Neurological Surgery

## 2019-10-16 DIAGNOSIS — I10 Essential (primary) hypertension: Secondary | ICD-10-CM

## 2019-10-31 ENCOUNTER — Ambulatory Visit: Admission: RE | Admit: 2019-10-31 | Payer: Medicare Other | Source: Ambulatory Visit

## 2019-11-09 ENCOUNTER — Other Ambulatory Visit: Payer: Self-pay

## 2019-11-09 ENCOUNTER — Ambulatory Visit
Admission: RE | Admit: 2019-11-09 | Discharge: 2019-11-09 | Disposition: A | Discharge status: Home / Self Care | Payer: Medicare Other | Source: Ambulatory Visit | Attending: Neurological Surgery | Admitting: Neurological Surgery

## 2019-11-09 DIAGNOSIS — Z8673 Personal history of transient ischemic attack (TIA), and cerebral infarction without residual deficits: Secondary | ICD-10-CM | POA: Insufficient documentation

## 2019-11-09 DIAGNOSIS — I6200 Nontraumatic subdural hemorrhage, unspecified: Secondary | ICD-10-CM | POA: Diagnosis not present

## 2019-11-09 DIAGNOSIS — I10 Essential (primary) hypertension: Secondary | ICD-10-CM | POA: Diagnosis present

## 2020-01-11 ENCOUNTER — Other Ambulatory Visit: Payer: Self-pay | Admitting: Neurological Surgery

## 2020-01-11 DIAGNOSIS — S065XAA Traumatic subdural hemorrhage with loss of consciousness status unknown, initial encounter: Secondary | ICD-10-CM

## 2020-01-11 DIAGNOSIS — S065X9A Traumatic subdural hemorrhage with loss of consciousness of unspecified duration, initial encounter: Secondary | ICD-10-CM

## 2020-02-06 ENCOUNTER — Other Ambulatory Visit: Payer: Self-pay

## 2020-02-06 ENCOUNTER — Ambulatory Visit
Admission: RE | Admit: 2020-02-06 | Discharge: 2020-02-06 | Disposition: A | Discharge status: Home / Self Care | Payer: Medicare Other | Source: Ambulatory Visit | Attending: Neurological Surgery | Admitting: Neurological Surgery

## 2020-02-06 DIAGNOSIS — S065X9A Traumatic subdural hemorrhage with loss of consciousness of unspecified duration, initial encounter: Secondary | ICD-10-CM | POA: Diagnosis present

## 2020-02-06 DIAGNOSIS — S065XAA Traumatic subdural hemorrhage with loss of consciousness status unknown, initial encounter: Secondary | ICD-10-CM

## 2020-03-30 ENCOUNTER — Emergency Department: Payer: Medicare Other

## 2020-03-30 ENCOUNTER — Emergency Department
Admission: EM | Admit: 2020-03-30 | Discharge: 2020-03-30 | Disposition: A | Discharge status: Home / Self Care | Payer: Medicare Other | Attending: Emergency Medicine | Admitting: Emergency Medicine

## 2020-03-30 ENCOUNTER — Other Ambulatory Visit: Payer: Self-pay

## 2020-03-30 ENCOUNTER — Encounter: Payer: Self-pay | Admitting: Emergency Medicine

## 2020-03-30 DIAGNOSIS — Y939 Activity, unspecified: Secondary | ICD-10-CM | POA: Insufficient documentation

## 2020-03-30 DIAGNOSIS — Y92480 Sidewalk as the place of occurrence of the external cause: Secondary | ICD-10-CM | POA: Insufficient documentation

## 2020-03-30 DIAGNOSIS — Z7982 Long term (current) use of aspirin: Secondary | ICD-10-CM | POA: Diagnosis not present

## 2020-03-30 DIAGNOSIS — S0990XA Unspecified injury of head, initial encounter: Secondary | ICD-10-CM | POA: Diagnosis present

## 2020-03-30 DIAGNOSIS — Y999 Unspecified external cause status: Secondary | ICD-10-CM | POA: Diagnosis not present

## 2020-03-30 DIAGNOSIS — Y92096 Garden or yard of other non-institutional residence as the place of occurrence of the external cause: Secondary | ICD-10-CM | POA: Diagnosis not present

## 2020-03-30 DIAGNOSIS — W010XXA Fall on same level from slipping, tripping and stumbling without subsequent striking against object, initial encounter: Secondary | ICD-10-CM | POA: Insufficient documentation

## 2020-03-30 NOTE — ED Provider Notes (Signed)
[facility] Emergency Department Provider Note  Time seen: 12:06 PM  I have reviewed the triage vital signs and the nursing notes.   HISTORY  Chief Complaint Head Injury   HPI Russell Holmes is a 79 y.o. male with a past medical history of a prior subdural hematoma presents to the emergency department after a fall and head injury.  According to the patient this past September patient suffered a subdural hematoma.  Was released from neurosurgery care recently.  However today while doing yard work patient tripped and fell on the sidewalk hitting his left head on the ground.  No LOC.  Patient denies any blood thinner use.  Denies any weakness or numbness.  States mild head pain but denies any headache.  No neck pain or back pain.   History reviewed. No pertinent past medical history.  Patient Active Problem List   Diagnosis Date Noted  . Subdural hematoma ([place]) 06/08/2019  . 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 BREAST LUMPECTOMY    . BURR HOLE Right 06/08/2019   Procedure: Haskell Flirt;  Surgeon: Kristeen Miss, MD;  Location: [place];  Service: Neurosurgery;  Laterality: Right;  . HERNIA REPAIR      Prior to Admission medications   Medication Sig Start Date End Date Taking? Authorizing Provider  amLODipine (NORVASC) 5 MG tablet Take 5 mg by mouth daily.  07/27/18   [provider]  aspirin EC 81 MG tablet Take by mouth.    [provider]  brimonidine (ALPHAGAN) 0.2 % ophthalmic solution Place 1 drop into both eyes 2 (two) times daily.     [provider]  glucose blood (PRECISION QID TEST) test strip Use 1 each 2 (two) times daily. Use as instructed.    [provider]  latanoprost (XALATAN) 0.005 % ophthalmic solution Place 1 drop into both eyes at bedtime.    [provider]  levETIRAcetam (KEPPRA) 500 MG tablet Take 1 tablet (500 mg total) by mouth 2  (two) times daily. 06/12/19   Viona Gilmore D, NP  lovastatin (MEVACOR) 40 MG tablet Take 40 mg by mouth daily.  06/21/18   [provider]  metFORMIN (GLUCOPHAGE) 500 MG tablet Take 1,000 mg by mouth daily with breakfast.  07/18/18   [provider]  Multiple Vitamin (MULTIVITAMIN WITH MINERALS) TABS tablet Take 1 tablet by mouth daily. Centrum    [provider]  oxybutynin (DITROPAN-XL) 10 MG 24 hr tablet Take 1 tablet (10 mg total) by mouth daily. 10/11/18   Billey Co, MD  pantoprazole (PROTONIX) 20 MG tablet Take 20 mg by mouth daily.  08/22/18 08/22/19  [provider]  quinapril (ACCUPRIL) 40 MG tablet Take 40 mg by mouth daily.  07/18/18   [provider]  sertraline (ZOLOFT) 50 MG tablet Take 50 mg by mouth daily.  07/22/18   [provider]  timolol (TIMOPTIC) 0.5 % ophthalmic solution Place 1 drop into both eyes 2 (two) times daily. 03/02/19   [provider]    No Known Allergies  Family History  Problem Relation Age of Onset  . Prostate cancer Neg Hx   . Bladder Cancer Neg Hx   . Kidney cancer Neg Hx     Social History Social History   Tobacco Use  . Smoking status: Never Smoker  . Smokeless tobacco: Never Used  Substance Use Topics  . Alcohol use: Yes  Alcohol/week: 3.0 standard drinks    Types: 3 Cans of beer per week  . Drug use: Not on file    Review of Systems Constitutional: Negative for loss of consciousness. Cardiovascular: Negative for chest pain. Respiratory: Negative for shortness of breath. Gastrointestinal: Negative for abdominal pain, vomiting Musculoskeletal: Negative for musculoskeletal complaints Neurological: Negative for headache All other ROS negative  ____________________________________________   PHYSICAL EXAM:  VITAL SIGNS: ED Triage Vitals  Enc Vitals Group     BP 03/30/20 1114 (!) 142/71     Pulse Rate 03/30/20 1114 66     Resp 03/30/20 1114 18     Temp  03/30/20 1114 98.1 F (36.7 C)     Temp Source 03/30/20 1114 Oral     SpO2 03/30/20 1114 98 %     Weight 03/30/20 1115 141 lb (64 kg)     Height 03/30/20 1115 5\' 4"  (1.626 m)     Head Circumference --      Peak Flow --      Pain Score 03/30/20 1155 2     Pain Loc --      Pain Edu? --      Excl. in [place]? --     Constitutional: Alert and oriented. Well appearing and in no distress. Eyes: Normal exam ENT      Head: Abrasion to left frontal scalp.  No hematoma.      Mouth/Throat: Mucous membranes are moist. Cardiovascular: Normal rate, regular rhythm.  Respiratory: Normal respiratory effort without tachypnea nor retractions. Breath sounds are clear  Gastrointestinal: Soft and nontender. No distention.   Musculoskeletal: Nontender with normal range of motion in all extremities Neurologic:  Normal speech and language. No gross focal neurologic deficits.  Equal grip strength bilaterally.  5/5 strength in all extremities.  No cranial nerve deficits. Skin: Abrasion to left frontal scalp. Psychiatric: Mood and affect are normal. Speech and behavior are normal.   ____________________________________________     RADIOLOGY  CT shows chronic subdural without acute change.  ____________________________________________   INITIAL IMPRESSION / ASSESSMENT AND PLAN / ED COURSE  Pertinent labs & imaging results that were available during my care of the patient were reviewed by me and considered in my medical decision making (see chart for details).   Patient presents emergency department after head injury.  History of recent subdural hematoma.  No anticoagulation.  Patient has great strength in all extremities.  No other injuries.  We will obtain a CT scan of the head as a precaution given his recent subdural hematoma.  CT shows chronic subdural without change.  We will discharge patient home.  He will follow-up with his PCP.  Patient agreeable to plan of care.  [name] was evaluated  in Emergency Department on 03/30/2020 for the symptoms described in the history of present illness. He was evaluated in the context of the global COVID-19 pandemic, which necessitated consideration that the patient might be at risk for infection with the SARS-CoV-2 virus that causes COVID-19. Institutional protocols and algorithms that pertain to the evaluation of patients at risk for COVID-19 are in a state of rapid change based on information released by regulatory bodies including the CDC and federal and state organizations. These policies and algorithms were followed during the patient's care in the ED.  ____________________________________________   FINAL CLINICAL IMPRESSION(S) / ED DIAGNOSES  Head injury   Harvest Dark, MD 03/30/20 1322

## 2020-03-30 NOTE — ED Triage Notes (Signed)
Pt to ER states he tripped and fell over the cement at his shed. Pt hi head on cement, noted abrasion to forehead., bleeding controlled.  Pt denies LOC.  Pt states takes blood thinners.

## 2021-03-06 ENCOUNTER — Encounter: Payer: Self-pay | Admitting: Urology

## 2021-03-06 ENCOUNTER — Other Ambulatory Visit: Payer: Self-pay

## 2021-03-06 ENCOUNTER — Ambulatory Visit: Payer: Medicare Other | Admitting: Urology

## 2021-03-06 VITALS — BP 144/93 | HR 96 | Ht 64.0 in | Wt 138.0 lb

## 2021-03-06 DIAGNOSIS — R972 Elevated prostate specific antigen [PSA]: Secondary | ICD-10-CM | POA: Diagnosis not present

## 2021-03-06 DIAGNOSIS — N3281 Overactive bladder: Secondary | ICD-10-CM | POA: Diagnosis not present

## 2021-03-06 DIAGNOSIS — N138 Other obstructive and reflux uropathy: Secondary | ICD-10-CM

## 2021-03-06 DIAGNOSIS — N401 Enlarged prostate with lower urinary tract symptoms: Secondary | ICD-10-CM

## 2021-03-06 MED ORDER — TAMSULOSIN HCL 0.4 MG PO CAPS: 0.4000 mg | ORAL_CAPSULE | Freq: Every day | ORAL | 11 refills | Status: DC

## 2021-03-06 NOTE — Progress Notes (Signed)
Interpreter [id number]

## 2021-03-06 NOTE — Progress Notes (Signed)
   03/06/2021 9:34 AM   Russell Holmes 09/16/1941 [id number]  Reason for visit: Follow up elevated PSA, urinary symptoms  HPI: 80 year old male with past medical history notable for stroke, type 2 diabetes, and hypertension who I previously saw in January 2020 for a PSA of 6.2, as well as overactive urinary symptoms of urgency and frequency.  He refused DRE at that visit, and elected to discontinue screening per the recommendations of the AUA guidelines and his comorbidities.  Urinalysis at that visit was benign, and PVR was normal at 27 mL.  He was drinking 3-4 alcoholic beverages in the evening that were likely contributing to his nocturia.  He was previously on Flomax which he felt was helping the urinary symptoms.  At that time I recommended adding a trial of oxybutynin XL 10 mg daily and follow-up in 3 months, but he never followed up.  It does not sound like he ever filled the oxybutynin or tried that medication.  He also discontinued the Flomax at some point over the last year for unclear reasons.  PSA has continued to rise since that time, and was 7.2 in August 2020, and 10.98 in May 2022.  We had a long conversation today about both his urinary symptoms and the elevated PSA.  Regarding the urinary symptoms, it sounds like he is consuming at least 5 or more alcoholic beverages during the day which is likely contributing to the frequency and urgency, as well as the nocturia.  We also discussed that previously he had had improvement on the Flomax, but discontinued this medication which may also be contributing to the urinary symptoms worsening.  We reviewed the overlap between BPH and overactive bladder symptoms, as well as the relationship between high alcohol intake and urinary frequency and urgency.  He is amenable to restarting the Flomax, and risks and benefits discussed.  Regarding the PSA, we discussed the AUA guidelines regarding PSA screening, and the recommendation against routine  screening in men over age 61.  We discussed controversies regarding the risks and benefits of screening.  At this point he has 3 PSAs over the last few years that have continued to increase, with most recent value of nearly 66 in May 2022, and I think further evaluation is warranted at this point.  We discussed options including trending the PSA, prostate biopsy, or prostate MRI.  He was hesitant to pursue prostate biopsy, but was amenable to prostate MRI.  Trial of Flomax 0.4 mg nightly for urinary symptoms Prostate MRI for elevated PSA of 11 RTC 4 to 6 weeks to review urinary symptoms and prostate MRI results   Billey Co, MD  [facility] [address] [place], [place] [address] [phone]

## 2021-03-06 NOTE — Patient Instructions (Signed)
Prostate MRI Prep: ? ?1- No ejaculation 48 hours prior to exam ? ?2- No food or drink or caffeine 4 hours prior to exam ? ?3- Fleets enema needs to be done 4 hours prior to exam  ? ?4- Urinate just prior to exam  ?

## 2021-04-14 ENCOUNTER — Ambulatory Visit
Admission: RE | Admit: 2021-04-14 | Discharge: 2021-04-14 | Disposition: A | Discharge status: Home / Self Care | Payer: Medicare Other | Source: Ambulatory Visit | Attending: Urology | Admitting: Urology

## 2021-04-14 ENCOUNTER — Other Ambulatory Visit: Payer: Self-pay

## 2021-04-14 DIAGNOSIS — R972 Elevated prostate specific antigen [PSA]: Secondary | ICD-10-CM | POA: Insufficient documentation

## 2021-04-14 MED ORDER — GADOBUTROL 1 MMOL/ML IV SOLN
6.0000 mL | Freq: Once | INTRAVENOUS | Status: AC | PRN
  Administered 2021-04-14: 6 mL via INTRAVENOUS

## 2021-04-17 ENCOUNTER — Other Ambulatory Visit: Payer: Self-pay

## 2021-04-17 ENCOUNTER — Ambulatory Visit (INDEPENDENT_AMBULATORY_CARE_PROVIDER_SITE_OTHER): Payer: Medicare Other | Admitting: Urology

## 2021-04-17 ENCOUNTER — Encounter: Payer: Self-pay | Admitting: Urology

## 2021-04-17 VITALS — BP 180/97 | HR 77 | Ht 64.0 in | Wt 142.0 lb

## 2021-04-17 DIAGNOSIS — N138 Other obstructive and reflux uropathy: Secondary | ICD-10-CM | POA: Diagnosis not present

## 2021-04-17 DIAGNOSIS — R972 Elevated prostate specific antigen [PSA]: Secondary | ICD-10-CM

## 2021-04-17 DIAGNOSIS — N401 Enlarged prostate with lower urinary tract symptoms: Secondary | ICD-10-CM

## 2021-04-17 DIAGNOSIS — N3281 Overactive bladder: Secondary | ICD-10-CM | POA: Diagnosis not present

## 2021-04-17 LAB — BLADDER SCAN AMB NON-IMAGING

## 2021-04-17 NOTE — Progress Notes (Signed)
   04/17/2021 10:49 AM   Russell Holmes 03-29-1941 [id number]  Reason for visit: Follow up BPH/LUTS, elevated PSA, review MRI results  HPI:  80 year old male with history of stroke, type 2 diabetes, and hypertension who I am following for the above issues.  Regarding his urinary symptoms, he was having significant nocturia likely secondary to multiple alcoholic beverages in the evening, and our last visit we started Flomax nightly.  This is significantly improved his urinary symptoms, and IPSS score today is 8, with quality of life pleased.  PVR is normal at 11 mL.  He was also found to have an elevated PSA of 11 from 6.2 in 2020, and using shared decision making he opted for a prostate MRI.  I personally viewed and interpreted the prostate MRI dated 04/14/2021 that shows a PI-RADS category 5 lesion in the right peripheral zone and a smaller category 4 lesion in the left peripheral zone.  No adenopathy or obvious extraprostatic extension.  We discussed the need for prostate biopsy for further evaluation of the suspicious lesions in the setting of his elevated PSA.  We discussed the risks of bleeding, infection, sepsis, and temporary urinary symptoms/ED, and possible need for additional treatments pending pathology results.  Referral placed to [facility] in [place] for MRI fusion biopsy, follow-up here to discuss results Continue Flomax for [place], MD  [facility] [address] [place], [place] [address] [phone]

## 2021-04-17 NOTE — Patient Instructions (Addendum)
Prostate Biopsy Instructions  Stop all aspirin or blood thinners (aspirin, plavix, coumadin, warfarin, motrin, ibuprofen, advil, aleve, naproxen, naprosyn) for 7 days prior to the procedure.  If you have any questions about stopping these medications, please contact your primary care physician or cardiologist.  Having a light meal prior to the procedure is recommended.  If you are diabetic or have low blood sugar please bring a small snack or glucose tablet.  A Fleets enema is needed to be purchased over the counter at a local pharmacy and used 2 hours before you scheduled appointment.  This can be purchased over the counter at any pharmacy.  Antibiotics will be administered in the clinic at the time of the procedure unless otherwise specified.    Please bring someone with you to the procedure to drive you home.  A follow up appointment has been scheduled for you to receive the results of the biopsy.  If you have any questions or concerns, please feel free to call the office at [phone] or send a Mychart message.    Thank you, Staff at [facility]   .bua

## 2021-06-11 ENCOUNTER — Other Ambulatory Visit: Payer: Self-pay

## 2021-06-11 ENCOUNTER — Ambulatory Visit (INDEPENDENT_AMBULATORY_CARE_PROVIDER_SITE_OTHER): Payer: Medicare Other | Admitting: Urology

## 2021-06-11 ENCOUNTER — Other Ambulatory Visit: Payer: Self-pay | Admitting: Urology

## 2021-06-11 ENCOUNTER — Encounter: Payer: Self-pay | Admitting: Urology

## 2021-06-11 VITALS — BP 173/80 | HR 77 | Ht 64.0 in | Wt 141.5 lb

## 2021-06-11 DIAGNOSIS — C61 Malignant neoplasm of prostate: Secondary | ICD-10-CM | POA: Diagnosis not present

## 2021-06-11 NOTE — Patient Instructions (Signed)
Prostate Cancer The prostate is a small gland that produces fluid that makes up semen (seminal fluid). It is located below the bladder in men, in front of the rectum. Prostate cancer is the abnormal growth of cells in the prostate gland. What are the causes? The exact cause of this condition is not known. What increases the risk? You are more likely to develop this condition if: You are 79 years of age or older. You have a family history of prostate cancer. You have a family history of breast and ovarian cancer. You have genes that are passed from parent to child (inherited), such as BRCA1 and BRCA2. You have Lynch syndrome. African American men and men of African descent are diagnosed with prostate cancer at higher rates than other men. The reasons for this are not well understood and are likely due to a combination of genetic and environmental factors. What are the signs or symptoms? Symptoms of this condition include: Problems with urination. This may include: A weak or interrupted flow of urine. Trouble starting or stopping urination. Trouble emptying the bladder all the way. The need to urinate more often, especially at night. Blood in urine or semen. Persistent pain or discomfort in the lower back, lower abdomen, or hips. Trouble getting an erection. Weakness or numbness in the legs or feet. How is this diagnosed? This condition can be diagnosed with: A digital rectal exam. For this exam, a health care provider inserts a gloved finger into the rectum to feel the prostate gland. A blood test called a prostate-specific antigen (PSA) test. A procedure in which a sample of tissue is taken from the prostate and checked under a microscope (prostate biopsy). An imaging test called transrectal ultrasonography. Once the condition is diagnosed, tests will be done to determine how far the cancer has spread. This is called staging the cancer. Staging may involve imaging tests, such as a bone  scan, CT scan, PET scan, or MRI. Stages of prostate cancer The stages of prostate cancer are as follows: Stage 1 (I). At this stage, the cancer is found in the prostate only. The cancer is not visible on imaging tests, and it is usually found by accident, such as during prostate surgery. Stage 2 (II). At this stage, the cancer is more advanced than it is in stage 1, but the cancer has not spread outside the prostate. Stage 3 (III). At this stage, the cancer has spread beyond the outer layer of the prostate to nearby tissues. The cancer may be found in the seminal vesicles, which are near the bladder and the prostate. Stage 4 (IV). At this stage, the cancer has spread to other parts of the body, such as the lymph nodes, bones, bladder, rectum, liver, or lungs. Prostate cancer grading Prostate cancer is also graded according to how the cancer cells look under a microscope. This is called the Gleason score and the total score can range from 6-10, indicating how likely it is that the cancer will spread (metastasize) to other parts of the body. The higher the score, the greater the likelihood that the cancer will spread. Gleason 6 or lower: This indicates that the cancer cells look similar to normal prostate cells (well differentiated). Gleason 7: This indicates that the cancer cells look somewhat similar to normal prostate cells (moderately differentiated). Gleason 8, 9, or 10: This indicates that the cancer cells look very different than normal prostate cells (poorly differentiated). How is this treated? Treatment for this condition depends on several factors,  including the stage of the cancer, your age, personal preferences, and your overall health. Talk with your health care provider about treatment options that are recommended for you. Common treatments include: Observation for early stage prostate cancer (active surveillance). This involves having exams, blood tests, and in some cases, more biopsies.  For some men, this is the only treatment needed. Surgery. Types of surgeries include: Open surgery (radical prostatectomy). In this surgery, a larger incision is made to remove the prostate. A laparoscopic radical prostatectomy. This is a surgery to remove the prostate and lymph nodes through several small incisions. It is often referred to as a minimally invasive surgery. A robotic radical prostatectomy. This is laparoscopic surgery to remove the prostate and lymph nodes with the help of robotic arms that are controlled by the surgeon. Cryoablation. This is surgery to freeze and destroy cancer cells. Radiation treatment. Types of radiation treatment include: External beam radiation. This type aims beams of radiation from outside the body at the prostate to destroy cancerous cells. Brachytherapy. This type uses radioactive needles, seeds, wires, or tubes that are implanted into the prostate gland. Like external beam radiation, brachytherapy destroys cancerous cells. An advantage is that this type of radiation limits the damage to surrounding tissue and has fewer side effects. Chemotherapy. This treatment kills cancer cells or stops them from multiplying. It kills both cancer cells and normal cells. Targeted therapy. This treatment uses medicines to kill cancer cells without damaging normal cells. Hormone treatment. This treatment involves taking medicines that act on testosterone, one of the male hormones, by: Stopping your body from producing testosterone. Blocking testosterone from reaching cancer cells. Follow these instructions at home: Lifestyle Do not use any products that contain nicotine or tobacco. These products include cigarettes, chewing tobacco, and vaping devices, such as e-cigarettes. If you need help quitting, ask your health care provider. Eat a healthy diet. To do this: Eat foods that are high in fiber. These include beans, whole grains, and fresh fruits and vegetables. Limit  foods that are high in fat and sugar. These include fried or sweet foods. Treatment for prostate cancer may affect sexual function. If you have a partner, continue to have intimate moments. This may include touching, holding, hugging, and caressing your partner. Get plenty of sleep. Consider joining a support group for men who have prostate cancer. Meeting with a support group may help you learn to manage the stress of having cancer. General instructions Take over-the-counter and prescription medicines only as told by your health care provider. If you have to go to the hospital, notify your cancer specialist (oncologist). Keep all follow-up visits. This is important. Where to find more information American Cancer Society: www.cancer.org American Society of Clinical Oncology: www.cancer.net National Cancer Institute: www.cancer.gov Contact a health care provider if: You have new or increasing trouble urinating. You have new or increasing blood in your urine. You have new or increasing pain in your hips, back, or chest. Get help right away if: You have weakness or numbness in your legs. You cannot control urination or your bowel movements (incontinence). You have chills or a fever. Summary The prostate is a small gland that is involved in the production of semen. It is located below a man's bladder, in front of the rectum. Prostate cancer is the abnormal growth of cells in the prostate gland. Treatment for this condition depends on the stage of the cancer, your age, personal preferences, and your overall health. Talk with your health care provider about treatment   options that are recommended for you. Consider joining a support group for men who have prostate cancer. Meeting with a support group may help you learn to manage the stress of having cancer. This information is not intended to replace advice given to you by your health care provider. Make sure you discuss any questions you have with  your health care provider. Document Revised: 12/18/2020 Document Reviewed: 12/18/2020 Elsevier Patient Education  [place] for Prostate Cancer Brachytherapy for prostate cancer is a type of internal radiation treatment that involves placing a source of radiation right inside the prostate gland. This allows the delivery of a higher dose of radiation than would be possible with external radiation therapy treatment. There are many types of brachytherapy: Low-dose rate (LDR) therapy. This involves temporary or permanent implants of radioactive seeds or pellets that give off a low dose of radiation. Temporary low-dose implants are left in the prostate for 1-7 days. The radioactive material is contained within a delivery tool, which may be a needle, a small, thin tube (catheter), or another type of applicator. You will need to stay in the hospital while the delivery tool and radioactive material are in place. Permanent low-dose implants are left in the prostate. They slowly give off radiation for many months after they are inserted. After the radiation is gone, they just stay in the body. They do not cause any harm and are not removed. High-dose rate (HDR) therapy. This involves inserting a material that gives off a higher dose of radiation for only a few minutes. The radioactive material is often wires or ribbons contained within a delivery tool (needle, applicator, or catheter). The delivery tool is removed after treatment, and no radioactive material is left in the prostate. In brachytherapy, the radiation does not travel far from the prostate, so healthy tissues around the prostate receive only a small dose of radiation. This helps to protect those tissues from injury. In some cases, brachytherapy may be given along with external beam radiation. Tell a health care provider about: Any allergies you have. All medicines you are taking, including vitamins, herbs, eye drops, creams,  and over-the-counter medicines. Any problems you or family members have had with anesthetic medicines. Any bleeding problems you have. Any surgeries you have had. Any medical conditions you have. Any prostate infections you have had. What are the risks? Generally, this is a safe procedure. However, problems may occur, including: Inflammation of the rectum. Problems getting or keeping an erection (erectile dysfunction). Inability to control when you urinate or have bowel movements (incontinence). Damage to nearby structures or organs. Diarrhea. Bleeding. What happens before the procedure? Staying hydrated Follow instructions from your health care provider about hydration, which may include: Up to 2 hours before the procedure - you may continue to drink clear liquids, such as water, clear fruit juice, black coffee, and plain tea.  Eating and drinking restrictions Follow instructions from your health care provider about eating and drinking, which may include: 8 hours before the procedure - stop eating heavy meals or foods, such as meat, fried foods, or fatty foods. 6 hours before the procedure - stop eating light meals or foods, such as toast or cereal. 6 hours before the procedure - stop drinking milk or drinks that contain milk. 2 hours before the procedure - stop drinking clear liquids. Medicines Ask your health care provider about: Changing or stopping your regular medicines. This is especially important if you are taking diabetes medicines or blood thinners. Taking  medicines such as aspirin and ibuprofen. These medicines can thin your blood. Do not take these medicines unless your health care provider tells you to take them. Taking over-the-counter medicines, vitamins, herbs, and supplements. Follow your health care provider's instructions about cleaning out your bowels. Surgery safety Ask your health care provider: How your surgery site will be marked. What steps will be taken to  help prevent infection. These may include: Removing hair at the procedure site. Washing skin with a germ-killing soap. Taking antibiotic medicine before and after the procedure. General instructions You may have exams or testing done before or after the procedure. Blood or urine samples may be taken. You may need imaging tests, such as a CT scan or an MRI. Do not use any products that contain nicotine or tobacco for at least 4 weeks before the procedure. This includes cigarettes, chewing tobacco, and vaping devices, such as e-cigarettes. If you need help quitting, ask your health care provider. Plan to have a responsible adult take you home from the hospital or clinic. Plan to have a responsible adult care for you for the time you are told after you leave the hospital or clinic. What happens during the procedure? An IV will be put into one of the veins in your arm or hand. You may be given: A medicine to help you relax (sedative). A medicine to numb the area (local anesthetic). A medicine to make you fall asleep (general anesthetic). A thin, flexible tube (Foley catheter) might be put into your penis, through your urethra, and into your bladder to drain your urine. Your surgeon will insert the radioactive material. The method used will depend on whether you are receiving temporary or permanent brachytherapy. Temporary low-dose or high-dose brachytherapy A delivery tool (needle, applicator, or catheter) will be put into the prostate. It will be inserted through a body cavity, like the rectum, or through the perineum, which is the area beneath the scrotum. An X-ray, ultrasound, MRI, or CT scan will be used to guide the delivery tool into the prostate. Radioactive seeds, pellets, wires, or ribbons will be fed through the delivery tool. If the high-dose method is used: The radioactive material will be left in for a few minutes and then removed. When the treatment is finished, the delivery tool  will be removed. If the low-dose method is used: The delivery tool containing the radioactive material will stay in place for 1-7 days. You will stay in the hospital while the implant is in place. When the treatment is finished, the radioactive material and delivery tool will be removed. Permanent low-dose brachytherapy  A tube or needle will be used to inject small, radioactive seeds or pellets into your prostate. The needle or tube will be removed, leaving the seeds or pellets in the prostate. The procedure may vary among health care providers and hospitals. What happens after the procedure? Your blood pressure, heart rate, breathing rate, and blood oxygen level will be monitored until you leave the hospital or clinic. If you were given a sedative during the procedure, it can affect you for several hours. Do not drive or operate machinery until your health care provider says that it is safe. If radiation seeds were left in your prostate, be sure you understand any safety precautions you are to follow at home. Summary Brachytherapy for prostate cancer is a type of radiation treatment that involves placing a source of radiation right inside the prostate gland. There are several types of brachytherapy for prostate cancer: low-dose  temporary treatment, low-dose permanent treatment, and high-dose temporary treatment. The amount of time that the source of radiation is left in your prostate will depend on the type of brachytherapy you are having. This information is not intended to replace advice given to you by your health care provider. Make sure you discuss any questions you have with your health care provider. Document Revised: 12/18/2020 Document Reviewed: 12/18/2020 Elsevier Patient Education  [place].

## 2021-06-11 NOTE — Progress Notes (Signed)
   06/11/2021 2:22 PM   Russell Holmes [birth year] [id number]  Reason for visit: Discuss prostate biopsy results  HPI: 80 year old male with history of stroke, type 2 diabetes, hypertension.  I have followed him for both elevated PSA and urinary symptoms.  His urinary symptoms have improved significantly on Flomax as well as cutting back on alcoholic beverages.  Most recently PSA was found to be elevated to 11 from 6.2 in 2020, and using shared decision making opted for a prostate MRI, and this showed a PI-RADS category 5 lesion in the right peripheral zone, and a category 4 lesion in the left peripheral zone.  There was no extraprostatic extension or adenopathy.  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  Prostate volume was only 20 g.  We had a lengthy conversation today about the patient's new diagnosis of prostate cancer.  We reviewed the risk classifications per the AUA guidelines including very low risk, low risk, intermediate risk, and high risk disease, and the need for additional staging imaging with CT and bone scan in patients with unfavorable intermediate risk and high risk disease.  I explained that his life expectancy, clinical stage, Gleason score, PSA, and other co-morbidities influence treatment strategies.  We discussed the roles of active surveillance, radiation therapy, surgical therapy with robotic prostatectomy, and hormone therapy with androgen deprivation.  We discussed that patients urinary symptoms also impact treatment strategy, as patients with severe lower urinary tract symptoms may have significant worsening or even develop urinary retention after undergoing radiation.  In regards to surgery, we discussed robotic prostatectomy +/- lymphadenectomy at length.  The procedure takes 3 to 4 hours, and patient's typically discharge home on post-op  day #1.  A Foley catheter is left in place for 7 to 10 days to allow for healing of the vesicourethral anastomosis.  There is a small risk of bleeding, infection, damage to surrounding structures or bowel, hernia, DVT/PE, or serious cardiac or pulmonary complications.  We discussed at length post-op side effects including erectile dysfunction, and the importance of pre-operative erectile function on long-term outcomes.  Even with a nerve sparing approach, there is an approximately 25% rate of permanent erectile dysfunction.  We also discussed postop urinary incontinence at length.  We expect patients to have stress incontinence post-operatively that will improve over period of weeks to months.  Less than 10% of men will require a pad at 1 year after surgery.  Patients will need to avoid heavy lifting and strenuous activity for 3 to 4 weeks, but most men return to their baseline activity status by 6 weeks.  With his age and comorbidities he would not be a good candidate for surgery, and I think radiation and temporary ADT will be his best option.  We also reviewed the AUA guidelines and need for bone scan to complete staging.  Referral placed to radiation oncology for consideration of radiation, will coordinate treatment, it sounds like he is leaning towards brachytherapy  I spent 45 total minutes on the day of the encounter including pre-visit review of the medical record, face-to-face time with the patient, and post visit ordering of labs/imaging/tests.   Billey Co, [facility] [address] [place], [place] [address] [phone]

## 2021-06-13 ENCOUNTER — Telehealth: Payer: Self-pay

## 2021-06-13 NOTE — Telephone Encounter (Signed)
Per Dr. Diamantina Providence patient will need to start [facility] for ADT therapy and then continue with Eligard likely for 2 years.  Benefits investigation started through online portal, case ID# [id number] Awaiting response

## 2021-06-18 ENCOUNTER — Encounter: Payer: Self-pay | Admitting: Radiation Oncology

## 2021-06-18 ENCOUNTER — Other Ambulatory Visit: Payer: Self-pay | Admitting: *Deleted

## 2021-06-18 ENCOUNTER — Ambulatory Visit
Admission: RE | Admit: 2021-06-18 | Discharge: 2021-06-18 | Disposition: A | Discharge status: Home / Self Care | Payer: Medicare Other | Source: Ambulatory Visit | Attending: Radiation Oncology | Admitting: Radiation Oncology

## 2021-06-18 ENCOUNTER — Encounter (INDEPENDENT_AMBULATORY_CARE_PROVIDER_SITE_OTHER): Payer: Self-pay

## 2021-06-18 VITALS — BP 165/87 | HR 73 | Temp 97.1°F | Resp 16 | Wt 141.6 lb

## 2021-06-18 DIAGNOSIS — Z7984 Long term (current) use of oral hypoglycemic drugs: Secondary | ICD-10-CM | POA: Insufficient documentation

## 2021-06-18 DIAGNOSIS — Z79899 Other long term (current) drug therapy: Secondary | ICD-10-CM | POA: Diagnosis not present

## 2021-06-18 DIAGNOSIS — C61 Malignant neoplasm of prostate: Secondary | ICD-10-CM

## 2021-06-18 DIAGNOSIS — C775 Secondary and unspecified malignant neoplasm of intrapelvic lymph nodes: Secondary | ICD-10-CM | POA: Insufficient documentation

## 2021-06-18 NOTE — Consult Note (Signed)
NEW PATIENT EVALUATION  Name: Russell Holmes  MRN: [id number]  Date:   06/18/2021     DOB: Mar 20, 1941   This 80 y.o. male patient presents to the clinic for initial evaluation of stage IIIc (CTCN0M0) grade 5 adenocarcinoma presenting with a PSA of 11.  REFERRING PHYSICIAN: Grayland Ormond Hestle,*  CHIEF COMPLAINT:  Chief Complaint  Patient presents with   Prostate Cancer    Initial consultation    DIAGNOSIS: The encounter diagnosis was Prostate cancer ([place]).   PREVIOUS INVESTIGATIONS:  MRI scan reviewed bone scan ordered Clinical notes reviewed Pathology report reviewed  HPI: Patient is a 80 year old male followed by urology for elevated PSA and urinary symptoms.  He has been put on Flomax which has significant helped his urgency and frequency.  His PSA was up to 6.2 back in 2020 is climbed to 11 at this time.  MRI scan of his prostate showed a PI-RADS category 5 lesion in the right peripheral zone and a small PI-RADS category 4 lesion left peripheral zone.  No evidence of extracapsular extension pelvic lymphadenopathy or bone mets were noted.  He underwent MRI fusion biopsies showing a 9 out of 12 cores positive for mostly Gleason 9 (4+5) adenocarcinoma with 95% core involvement.  Prostate volume was only 20 g.  He has been seen by urology now referred to radiation collagen for opinion.  He continues to have fairly low lower urinary tract symptoms.  He has had herniorrhaphy repair in the past.  He is having no bone pain.  PLANNED TREATMENT REGIMEN: ADT therapy plus IMRT image guided radiation therapy to prostate and pelvic nodes  PAST MEDICAL HISTORY:  has no past medical history on file.    PAST SURGICAL HISTORY:  Past Surgical History:  Procedure Laterality Date   BREAST LUMPECTOMY     BURR HOLE Right 06/08/2019   Procedure: Haskell Flirt;  Surgeon: Kristeen Miss, MD;  Location: [facility];  Service: Neurosurgery;  Laterality: Right;   HERNIA REPAIR      FAMILY HISTORY: family  history is not on file.  SOCIAL HISTORY:  reports that he has never smoked. He has never used smokeless tobacco. He reports current alcohol use of about 8.0 standard drinks per week. He reports that he does not use drugs.  ALLERGIES: Patient has no known allergies.  MEDICATIONS:  Current Outpatient Medications  Medication Sig Dispense Refill   amLODipine (NORVASC) 5 MG tablet Take 5 mg by mouth daily.      brimonidine (ALPHAGAN) 0.2 % ophthalmic solution Place 1 drop into both eyes 2 (two) times daily.      glucose blood test strip Use 1 each 2 (two) times daily. Use as instructed.     ketorolac (ACULAR) 0.5 % ophthalmic solution 1 drop 4 (four) times daily.     latanoprost (XALATAN) 0.005 % ophthalmic solution Place 1 drop into both eyes at bedtime.     lovastatin (MEVACOR) 40 MG tablet Take 40 mg by mouth daily.      metFORMIN (GLUCOPHAGE) 500 MG tablet Take 1,000 mg by mouth daily with breakfast.      Multiple Vitamin (MULTIVITAMIN WITH MINERALS) TABS tablet Take 1 tablet by mouth daily. Centrum     quinapril (ACCUPRIL) 40 MG tablet Take 40 mg by mouth daily.      rosuvastatin (CRESTOR) 20 MG tablet Take 20 mg by mouth at bedtime.     sertraline (ZOLOFT) 50 MG tablet Take 50 mg by mouth daily.      tamsulosin (  FLOMAX) 0.4 MG CAPS capsule Take 1 capsule (0.4 mg total) by mouth daily. 30 capsule 11   timolol (TIMOPTIC) 0.5 % ophthalmic solution Place 1 drop into both eyes 2 (two) times daily.     pantoprazole (PROTONIX) 20 MG tablet Take 20 mg by mouth daily.      No current facility-administered medications for this encounter.    ECOG PERFORMANCE STATUS:  0 - Asymptomatic  REVIEW OF SYSTEMS: Patient denies any weight loss, fatigue, weakness, fever, chills or night sweats. Patient denies any loss of vision, blurred vision. Patient denies any ringing  of the ears or hearing loss. No irregular heartbeat. Patient denies heart murmur or history of fainting. Patient denies any chest pain or  pain radiating to her upper extremities. Patient denies any shortness of breath, difficulty breathing at night, cough or hemoptysis. Patient denies any swelling in the lower legs. Patient denies any nausea vomiting, vomiting of blood, or coffee ground material in the vomitus. Patient denies any stomach pain. Patient states has had normal bowel movements no significant constipation or diarrhea. Patient denies any dysuria, hematuria or significant nocturia. Patient denies any problems walking, swelling in the joints or loss of balance. Patient denies any skin changes, loss of hair or loss of weight. Patient denies any excessive worrying or anxiety or significant depression. Patient denies any problems with insomnia. Patient denies excessive thirst, polyuria, polydipsia. Patient denies any swollen glands, patient denies easy bruising or easy bleeding. Patient denies any recent infections, allergies or URI. Patient "s visual fields have not changed significantly in recent time.   PHYSICAL EXAM: BP (!) 165/87 (BP Location: Left Arm, Patient Position: Sitting)   Pulse 73   Temp (!) 97.1 F (36.2 C) (Tympanic)   Resp 16   Wt 141 lb 9.6 oz (64.2 kg)   BMI 24.31 kg/m  Well-developed well-nourished patient in NAD. HEENT reveals PERLA, EOMI, discs not visualized.  Oral cavity is clear. No oral mucosal lesions are identified. Neck is clear without evidence of cervical or supraclavicular adenopathy. Lungs are clear to A&P. Cardiac examination is essentially unremarkable with regular rate and rhythm without murmur rub or thrill. Abdomen is benign with no organomegaly or masses noted. Motor sensory and DTR levels are equal and symmetric in the upper and lower extremities. Cranial nerves II through XII are grossly intact. Proprioception is intact. No peripheral adenopathy or edema is identified. No motor or sensory levels are noted. Crude visual fields are within normal range.  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  Based on his age I have offered image guided IMRT radiation to his prostate and pelvic nodes.  We will treat up to 80 Gray to his prostate proximal seminal vesicle and 54 Gray to his pelvic lymph nodes using IMRT dose painting technique.  I have also asked Dr. Annetta Maw to place fiducial markers in his prostate for daily image guided treatment as well as start the patient on ADT therapy which will last from 2 to 3 years.  Risks and benefits of treatment including 6 increased lower urinary tract symptoms diarrhea fatigue alteration of blood count skin reaction all were reviewed in detail with the patient.  We will set up  simulation after markers are placed.  I have also ordered a bone scan based on the fact his PSA is over 10 for a baseline study although I do not think this will show evidence of metastatic disease.  Patient and wife both comprehend my recommendations well.  I would like to take this opportunity to thank you for allowing me to participate in the care of your patient.Noreene Filbert, MD

## 2021-06-19 ENCOUNTER — Institutional Professional Consult (permissible substitution): Payer: Medicare Other | Admitting: Radiation Oncology

## 2021-06-20 ENCOUNTER — Telehealth: Payer: Self-pay

## 2021-06-20 NOTE — Telephone Encounter (Signed)
Benefits investigation faxed for Eligard/Lupron.

## 2021-06-20 NOTE — Telephone Encounter (Signed)
-----   Message from Daiva Huge, RN sent at 06/18/2021 11:02 AM EDT ----- Regarding: [place] Rm seed markers/eligard/bone scan Good morning,   We need to get appt. For Eligard and gold seed markers placed with Dr. Diamantina Providence.  He has the markers.  His wife speaks english and they refused interpreter for [place].  Just FYI.   Let me know when you get it scheduled and I'll get his sim scheduled.    Thanks,   [facility]

## 2021-06-23 NOTE — Telephone Encounter (Signed)
Update from portal states that PA is required. Called and spoke w/April [facility] [phone]) unable to complete PA due to [facility] classified as an oncology medication( CPT- [phone], Jcode- [id number]). Transferred to [employer], spoke w/Kree R. In oncology Ref #[id number]. Attempted to fill out PA form over the phone unable to complete form due to question: What is treatment indication/status? Localized, regional, PSA recurrence, Metastatic. Patient is currently under initial staging and has not had his scans completed, unable to answer question until this is done. This was explained to [facility] and it was asked if there was an exception request or possible clinical review or someone on the clinical support team that could help assist with this matter. Emmaline Life states that due to the questionnaire not being completed she is not able to forward to clinical review and is not able to transfer to anyone else at this time. The form was requested to be faxed to the office so that when scans are complete the form can be sent completed to have approval consideration.

## 2021-06-24 ENCOUNTER — Encounter
Admission: RE | Admit: 2021-06-24 | Discharge: 2021-06-24 | Disposition: A | Discharge status: Home / Self Care | Payer: Medicare Other | Source: Ambulatory Visit | Attending: Urology | Admitting: Urology

## 2021-06-24 ENCOUNTER — Other Ambulatory Visit: Payer: Self-pay

## 2021-06-24 DIAGNOSIS — C61 Malignant neoplasm of prostate: Secondary | ICD-10-CM | POA: Diagnosis not present

## 2021-06-24 MED ORDER — TECHNETIUM TC 99M MEDRONATE IV KIT
20.0000 | PACK | Freq: Once | INTRAVENOUS | Status: AC | PRN
  Administered 2021-06-24: 21.74 via INTRAVENOUS

## 2021-06-30 NOTE — Telephone Encounter (Signed)
Eligard approved 06/25/21 - 06/25/22. Auth # [id number]

## 2021-07-03 NOTE — Telephone Encounter (Signed)
Authorization was Approved through online portal. Start date 07/03/21 through 07/03/2022. Josem Kaufmann #[id number]. Patient will receive injection at gold seed marker apt on 07/10/21. He will then be scheduled for Eligard injections per Dr. Diamantina Providence

## 2021-07-10 ENCOUNTER — Encounter: Payer: Self-pay | Admitting: Urology

## 2021-07-10 ENCOUNTER — Ambulatory Visit: Payer: Medicare Other | Admitting: Urology

## 2021-07-10 ENCOUNTER — Other Ambulatory Visit: Payer: Self-pay

## 2021-07-10 VITALS — BP 146/81 | HR 88 | Ht 64.0 in | Wt 141.0 lb

## 2021-07-10 DIAGNOSIS — C61 Malignant neoplasm of prostate: Secondary | ICD-10-CM | POA: Diagnosis not present

## 2021-07-10 MED ORDER — GENTAMICIN SULFATE 40 MG/ML IJ SOLN
80.0000 mg | Freq: Once | INTRAMUSCULAR | Status: AC
  Administered 2021-07-10: 80 mg via INTRAMUSCULAR

## 2021-07-10 MED ORDER — DEGARELIX ACETATE(240 MG DOSE) 120 MG/VIAL ~~LOC~~ SOLR
240.0000 mg | Freq: Once | SUBCUTANEOUS | Status: AC
  Administered 2021-07-10: 240 mg via SUBCUTANEOUS

## 2021-07-10 MED ORDER — LEVOFLOXACIN 500 MG PO TABS
500.0000 mg | ORAL_TABLET | Freq: Once | ORAL | Status: AC
  Administered 2021-07-10: 500 mg via ORAL

## 2021-07-10 NOTE — Progress Notes (Signed)
   07/10/21  Indication: High risk prostate cancer  Prostate gold seed marker procedure   Informed consent was obtained, and we discussed the risks of bleeding and infection/sepsis. A time out was performed to ensure correct patient identity.  Pre-Procedure: -PSA 11, MRI with PI-RADS 5 lesion in the right peripheral zone and PI-RADS 4 lesion of the left peripheral zone with no extraprostatic extension or adenopathy, fusion biopsy showed 9 cores positive for primarily high risk disease including 4+5 = 9 with 95% core involvement, prostate volume 20 g  Procedure: -2 gold seed markers were placed the base bilaterally, and a single seed at the midline apex  Post-Procedure: - Patient tolerated the procedure well - He was counseled to seek immediate medical attention if experiences significant bleeding, fevers, or severe pain  Assessment/ Plan: -ADT given today, 2 years total duration in setting of high risk disease.  Risks and benefits discussed. -Follow-up with radiation oncology for external beam radiation -RTC 6 months for PSA and to continue ADT for 2 years total  Nickolas Madrid, MD 07/10/2021

## 2021-07-10 NOTE — Patient Instructions (Signed)
Degarelix injection Qu es este medicamento? El DEGARELIX se [place] en el tratamiento de hombres con cncer avanzado de prstata. Este medicamento puede ser utilizado para otros usos; si tiene alguna pregunta consulte con su proveedor de atencin mdica o con su Development worker, international aid. MARCAS COMUNES: Degarelix, Firmagon Qu le debo informar a mi profesional de la salud antes de tomar este medicamento? Necesitan saber si usted presenta alguno de los WES[employer] International o situaciones: diabetes enfermedad cardiaca enfermedad renal enfermedad heptica niveles bajos de potasio o magnesio en la sangre osteoporosis una reaccin alrgica o inusual al degarelix, al manitol, a otros medicamentos, alimentos, colorantes o conservantes si est embarazada o buscando quedar embarazada si est amamantando a un beb Cmo debo utilizar este medicamento? Este medicamento se administra mediante inyeccin por va subcutnea. Generalmente lo administra un profesional de Technical sales engineer en un hospital o en un entorno clnico. Si recibe Coca[employer]a en su casa, le ensearn cmo prepararlo y Engineer, manufacturing. Use el medicamento exactamente como se le indique. Use su medicamento a intervalos regulares. No lo use con una frecuencia mayor a la indicada. Es importante que deseche las agujas y las jeringas usadas en un recipiente resistente a los pinchazos. No las deseche en la basura. Si no tiene un recipiente resistente a los pinchazos, llame a su farmacutico o proveedor de atencin de la salud para obtenerlo. Hable con su pediatra para informarse acerca del uso de este medicamento en nios. Puede requerir atencin especial. Sobredosis: Pngase en contacto inmediatamente con un centro toxicolgico o una sala de urgencia si usted cree que haya tomado demasiado medicamento. ATENCIN: [employer] Foods es solo para usted. No comparta este medicamento con nadie. Qu sucede si me olvido de una dosis? Intente no saltearse ninguna dosis. Si  olvida una dosis, hable con su mdico o profesional de la salud para que le indique Tax inspector. Qu puede interactuar con este medicamento? No use este medicamento con ninguno de los siguientes frmacos: cisaprida dronedarona pimozida tioridazina Este medicamento tambin puede interactuar con los siguientes frmacos: otros medicamentos que prolongan el intervalo QT (ritmo cardiaco anormal) Puede ser que esta lista no menciona todas las posibles interacciones. Informe a su profesional de KB Home	[place] de [employer] Corporation productos a base de hierbas, medicamentos de [place] o suplementos nutritivos que est tomando. Si usted fuma, consume bebidas alcohlicas o si utiliza drogas ilegales, indqueselo tambin a su profesional de KB Home	[place]. Algunas sustancias pueden interactuar con su medicamento. A qu debo estar atento al usar Coca-Cola? Debe visitar a su mdico o a su profesional de la salud para Programmer, applications su evolucin y para hablar sobre estos temas antes de comenzar a recibir Coca-Cola. No frotar ni rasguar el lugar de la inyeccin. Puede haber un bulto en el lugar de la inyeccin, o estar rojo o sentir dolor por Mirant de la dosis. Su mdico o su profesional de Public house manager supervisar sus niveles de hormonas en la sangre para vigilar su respuesta al tratamiento. Trate de asistir a cualquier cita para English as a second language teacher. Qu efectos secundarios puedo tener al [employer] Corporation este medicamento? Efectos secundarios que debe informar a su mdico o a Barrister's clerk de la salud tan pronto como sea posible: Chief of Staff, tales como erupcin cutnea, comezn/picazn o urticaria, e hinchazn de la cara, los labios o la lengua fiebre o escalofros frecuencia cardiaca irregular nuseas y vmito junto con dolor abdominal grave dolor o dificultad para Public affairs consultant en la pelvis o distensin abdominal signos y sntomas de  niveles altos de Dispensing optician, tales como  tener ms sed o apetito, o tener que orinar con mayor frecuencia que lo habitual. Tambin puede sentirse muy cansado o tener visin borrosa Efectos secundarios que generalmente no requieren Geophysical data processor (infrmelos a su mdico o a Barrister's clerk de la salud si persisten o si son molestos): cambios en el deseo o desempeo sexual estreimiento dolor de cabeza presin sangunea alta sofocos (enrojecimiento de la piel, aumento de la sudoracin) comezn/picazn, enrojecimiento o Engineer, agricultural de Air cabin crew en las articulaciones dificultad para dormir debilidad o cansancio inusuales aumento de peso Puede ser que esta lista no menciona todos los posibles efectos secundarios. Comunquese a su mdico por asesoramiento mdico Humana Inc. Usted puede informar los efectos secundarios a la FDA por telfono al 1-800-FDA-1088. Dnde debo guardar mi medicina? Mantenga fuera del alcance de los nios. Este medicamento por lo general se administra en hospitales o clnicas, y no necesitar guardarlo en su casa. En raras ocasiones, este medicamento podra administrarse en la casa. Si est News Corporation en su casa, le indicarn cmo guardarlo. Deseche todo el medicamento que no haya utilizado despus de la fecha de vencimiento indicada en la etiqueta. ATENCIN: Este folleto es un resumen. Puede ser que no cubra toda la posible informacin. Si usted tiene preguntas acerca de esta medicina, consulte con su mdico, su farmacutico o su profesional de Technical sales engineer.  2022 Elsevier/Gold Standard (2020-01-25 00:00:00)

## 2021-07-14 ENCOUNTER — Ambulatory Visit: Payer: Medicare Other

## 2021-07-14 DIAGNOSIS — Z510 Encounter for antineoplastic radiation therapy: Secondary | ICD-10-CM | POA: Diagnosis not present

## 2021-07-14 DIAGNOSIS — C61 Malignant neoplasm of prostate: Secondary | ICD-10-CM | POA: Diagnosis not present

## 2021-07-17 ENCOUNTER — Other Ambulatory Visit: Payer: Self-pay | Admitting: *Deleted

## 2021-07-17 DIAGNOSIS — C61 Malignant neoplasm of prostate: Secondary | ICD-10-CM | POA: Diagnosis not present

## 2021-07-22 ENCOUNTER — Ambulatory Visit: Admission: RE | Admit: 2021-07-22 | Payer: Medicare Other | Source: Ambulatory Visit

## 2021-07-23 ENCOUNTER — Ambulatory Visit
Admission: RE | Admit: 2021-07-23 | Discharge: 2021-07-23 | Disposition: A | Discharge status: Home / Self Care | Payer: Medicare Other | Source: Ambulatory Visit | Attending: Radiation Oncology | Admitting: Radiation Oncology

## 2021-07-23 DIAGNOSIS — C61 Malignant neoplasm of prostate: Secondary | ICD-10-CM | POA: Diagnosis not present

## 2021-07-24 ENCOUNTER — Ambulatory Visit
Admission: RE | Admit: 2021-07-24 | Discharge: 2021-07-24 | Disposition: A | Discharge status: Home / Self Care | Payer: Medicare Other | Source: Ambulatory Visit | Attending: Radiation Oncology | Admitting: Radiation Oncology

## 2021-07-24 DIAGNOSIS — C61 Malignant neoplasm of prostate: Secondary | ICD-10-CM | POA: Diagnosis not present

## 2021-07-25 ENCOUNTER — Ambulatory Visit
Admission: RE | Admit: 2021-07-25 | Discharge: 2021-07-25 | Disposition: A | Discharge status: Home / Self Care | Payer: Medicare Other | Source: Ambulatory Visit | Attending: Radiation Oncology | Admitting: Radiation Oncology

## 2021-07-25 DIAGNOSIS — C61 Malignant neoplasm of prostate: Secondary | ICD-10-CM | POA: Diagnosis not present

## 2021-07-28 ENCOUNTER — Ambulatory Visit
Admission: RE | Admit: 2021-07-28 | Discharge: 2021-07-28 | Disposition: A | Discharge status: Home / Self Care | Payer: Medicare Other | Source: Ambulatory Visit | Attending: Radiation Oncology | Admitting: Radiation Oncology

## 2021-07-28 DIAGNOSIS — C61 Malignant neoplasm of prostate: Secondary | ICD-10-CM | POA: Diagnosis not present

## 2021-07-29 ENCOUNTER — Ambulatory Visit
Admission: RE | Admit: 2021-07-29 | Discharge: 2021-07-29 | Disposition: A | Discharge status: Home / Self Care | Payer: Medicare Other | Source: Ambulatory Visit | Attending: Radiation Oncology | Admitting: Radiation Oncology

## 2021-07-29 DIAGNOSIS — C61 Malignant neoplasm of prostate: Secondary | ICD-10-CM | POA: Diagnosis not present

## 2021-07-30 ENCOUNTER — Ambulatory Visit
Admission: RE | Admit: 2021-07-30 | Discharge: 2021-07-30 | Disposition: A | Discharge status: Home / Self Care | Payer: Medicare Other | Source: Ambulatory Visit | Attending: Radiation Oncology | Admitting: Radiation Oncology

## 2021-07-30 DIAGNOSIS — C61 Malignant neoplasm of prostate: Secondary | ICD-10-CM | POA: Diagnosis not present

## 2021-07-31 ENCOUNTER — Ambulatory Visit
Admission: RE | Admit: 2021-07-31 | Discharge: 2021-07-31 | Disposition: A | Discharge status: Home / Self Care | Payer: Medicare Other | Source: Ambulatory Visit | Attending: Radiation Oncology | Admitting: Radiation Oncology

## 2021-07-31 DIAGNOSIS — C61 Malignant neoplasm of prostate: Secondary | ICD-10-CM | POA: Diagnosis not present

## 2021-08-01 ENCOUNTER — Ambulatory Visit
Admission: RE | Admit: 2021-08-01 | Discharge: 2021-08-01 | Disposition: A | Discharge status: Home / Self Care | Payer: Medicare Other | Source: Ambulatory Visit | Attending: Radiation Oncology | Admitting: Radiation Oncology

## 2021-08-01 DIAGNOSIS — C61 Malignant neoplasm of prostate: Secondary | ICD-10-CM | POA: Diagnosis not present

## 2021-08-04 ENCOUNTER — Ambulatory Visit
Admission: RE | Admit: 2021-08-04 | Discharge: 2021-08-04 | Disposition: A | Discharge status: Home / Self Care | Payer: Medicare Other | Source: Ambulatory Visit | Attending: Radiation Oncology | Admitting: Radiation Oncology

## 2021-08-04 DIAGNOSIS — C61 Malignant neoplasm of prostate: Secondary | ICD-10-CM | POA: Diagnosis not present

## 2021-08-05 ENCOUNTER — Ambulatory Visit
Admission: RE | Admit: 2021-08-05 | Discharge: 2021-08-05 | Disposition: A | Discharge status: Home / Self Care | Payer: Medicare Other | Source: Ambulatory Visit | Attending: Radiation Oncology | Admitting: Radiation Oncology

## 2021-08-05 DIAGNOSIS — C61 Malignant neoplasm of prostate: Secondary | ICD-10-CM | POA: Insufficient documentation

## 2021-08-05 DIAGNOSIS — Z510 Encounter for antineoplastic radiation therapy: Secondary | ICD-10-CM | POA: Insufficient documentation

## 2021-08-06 ENCOUNTER — Other Ambulatory Visit: Payer: Self-pay

## 2021-08-06 ENCOUNTER — Ambulatory Visit
Admission: RE | Admit: 2021-08-06 | Discharge: 2021-08-06 | Disposition: A | Discharge status: Home / Self Care | Payer: Medicare Other | Source: Ambulatory Visit | Attending: Radiation Oncology | Admitting: Radiation Oncology

## 2021-08-06 ENCOUNTER — Inpatient Hospital Stay: Payer: Medicare Other | Attending: Radiation Oncology

## 2021-08-06 DIAGNOSIS — Z510 Encounter for antineoplastic radiation therapy: Secondary | ICD-10-CM | POA: Insufficient documentation

## 2021-08-06 DIAGNOSIS — C61 Malignant neoplasm of prostate: Secondary | ICD-10-CM | POA: Diagnosis not present

## 2021-08-06 LAB — CBC
HCT: 38.7 % — ABNORMAL LOW (ref 39.0–52.0)
Hemoglobin: 13.3 g/dL (ref 13.0–17.0)
MCH: 32.7 pg (ref 26.0–34.0)
MCHC: 34.4 g/dL (ref 30.0–36.0)
MCV: 95.1 fL (ref 80.0–100.0)
Platelets: 190 10*3/uL (ref 150–400)
RBC: 4.07 MIL/uL — ABNORMAL LOW (ref 4.22–5.81)
RDW: 12.8 % (ref 11.5–15.5)
WBC: 4.9 10*3/uL (ref 4.0–10.5)
nRBC: 0 % (ref 0.0–0.2)

## 2021-08-07 ENCOUNTER — Ambulatory Visit
Admission: RE | Admit: 2021-08-07 | Discharge: 2021-08-07 | Disposition: A | Discharge status: Home / Self Care | Payer: Medicare Other | Source: Ambulatory Visit | Attending: Radiation Oncology | Admitting: Radiation Oncology

## 2021-08-07 DIAGNOSIS — C61 Malignant neoplasm of prostate: Secondary | ICD-10-CM | POA: Diagnosis not present

## 2021-08-08 ENCOUNTER — Ambulatory Visit
Admission: RE | Admit: 2021-08-08 | Discharge: 2021-08-08 | Disposition: A | Discharge status: Home / Self Care | Payer: Medicare Other | Source: Ambulatory Visit | Attending: Radiation Oncology | Admitting: Radiation Oncology

## 2021-08-08 DIAGNOSIS — C61 Malignant neoplasm of prostate: Secondary | ICD-10-CM | POA: Diagnosis not present

## 2021-08-11 ENCOUNTER — Ambulatory Visit
Admission: RE | Admit: 2021-08-11 | Discharge: 2021-08-11 | Disposition: A | Discharge status: Home / Self Care | Payer: Medicare Other | Source: Ambulatory Visit | Attending: Radiation Oncology | Admitting: Radiation Oncology

## 2021-08-11 DIAGNOSIS — C61 Malignant neoplasm of prostate: Secondary | ICD-10-CM | POA: Diagnosis not present

## 2021-08-12 ENCOUNTER — Ambulatory Visit
Admission: RE | Admit: 2021-08-12 | Discharge: 2021-08-12 | Disposition: A | Discharge status: Home / Self Care | Payer: Medicare Other | Source: Ambulatory Visit | Attending: Radiation Oncology | Admitting: Radiation Oncology

## 2021-08-12 DIAGNOSIS — C61 Malignant neoplasm of prostate: Secondary | ICD-10-CM | POA: Diagnosis not present

## 2021-08-13 ENCOUNTER — Ambulatory Visit
Admission: RE | Admit: 2021-08-13 | Discharge: 2021-08-13 | Disposition: A | Discharge status: Home / Self Care | Payer: Medicare Other | Source: Ambulatory Visit | Attending: Radiation Oncology | Admitting: Radiation Oncology

## 2021-08-13 DIAGNOSIS — C61 Malignant neoplasm of prostate: Secondary | ICD-10-CM | POA: Diagnosis not present

## 2021-08-14 ENCOUNTER — Ambulatory Visit
Admission: RE | Admit: 2021-08-14 | Discharge: 2021-08-14 | Disposition: A | Discharge status: Home / Self Care | Payer: Medicare Other | Source: Ambulatory Visit | Attending: Radiation Oncology | Admitting: Radiation Oncology

## 2021-08-14 DIAGNOSIS — C61 Malignant neoplasm of prostate: Secondary | ICD-10-CM | POA: Diagnosis not present

## 2021-08-15 ENCOUNTER — Ambulatory Visit: Payer: Medicare Other

## 2021-08-18 ENCOUNTER — Ambulatory Visit
Admission: RE | Admit: 2021-08-18 | Discharge: 2021-08-18 | Disposition: A | Discharge status: Home / Self Care | Payer: Medicare Other | Source: Ambulatory Visit | Attending: Radiation Oncology | Admitting: Radiation Oncology

## 2021-08-18 DIAGNOSIS — C61 Malignant neoplasm of prostate: Secondary | ICD-10-CM | POA: Diagnosis not present

## 2021-08-19 ENCOUNTER — Ambulatory Visit
Admission: RE | Admit: 2021-08-19 | Discharge: 2021-08-19 | Disposition: A | Discharge status: Home / Self Care | Payer: Medicare Other | Source: Ambulatory Visit | Attending: Radiation Oncology | Admitting: Radiation Oncology

## 2021-08-19 DIAGNOSIS — C61 Malignant neoplasm of prostate: Secondary | ICD-10-CM | POA: Diagnosis not present

## 2021-08-20 ENCOUNTER — Inpatient Hospital Stay: Payer: Medicare Other

## 2021-08-20 ENCOUNTER — Ambulatory Visit
Admission: RE | Admit: 2021-08-20 | Discharge: 2021-08-20 | Disposition: A | Discharge status: Home / Self Care | Payer: Medicare Other | Source: Ambulatory Visit | Attending: Radiation Oncology | Admitting: Radiation Oncology

## 2021-08-20 ENCOUNTER — Other Ambulatory Visit: Payer: Self-pay

## 2021-08-20 DIAGNOSIS — C61 Malignant neoplasm of prostate: Secondary | ICD-10-CM

## 2021-08-20 LAB — CBC
HCT: 38.2 % — ABNORMAL LOW (ref 39.0–52.0)
Hemoglobin: 13 g/dL (ref 13.0–17.0)
MCH: 32.7 pg (ref 26.0–34.0)
MCHC: 34 g/dL (ref 30.0–36.0)
MCV: 96.2 fL (ref 80.0–100.0)
Platelets: 198 10*3/uL (ref 150–400)
RBC: 3.97 MIL/uL — ABNORMAL LOW (ref 4.22–5.81)
RDW: 13.3 % (ref 11.5–15.5)
WBC: 4 10*3/uL (ref 4.0–10.5)
nRBC: 0 % (ref 0.0–0.2)

## 2021-08-21 ENCOUNTER — Ambulatory Visit
Admission: RE | Admit: 2021-08-21 | Discharge: 2021-08-21 | Disposition: A | Discharge status: Home / Self Care | Payer: Medicare Other | Source: Ambulatory Visit | Attending: Radiation Oncology | Admitting: Radiation Oncology

## 2021-08-21 DIAGNOSIS — C61 Malignant neoplasm of prostate: Secondary | ICD-10-CM | POA: Diagnosis not present

## 2021-08-22 ENCOUNTER — Ambulatory Visit
Admission: RE | Admit: 2021-08-22 | Discharge: 2021-08-22 | Disposition: A | Discharge status: Home / Self Care | Payer: Medicare Other | Source: Ambulatory Visit | Attending: Radiation Oncology | Admitting: Radiation Oncology

## 2021-08-22 DIAGNOSIS — C61 Malignant neoplasm of prostate: Secondary | ICD-10-CM | POA: Diagnosis not present

## 2021-08-25 ENCOUNTER — Ambulatory Visit
Admission: RE | Admit: 2021-08-25 | Discharge: 2021-08-25 | Disposition: A | Discharge status: Home / Self Care | Payer: Medicare Other | Source: Ambulatory Visit | Attending: Radiation Oncology | Admitting: Radiation Oncology

## 2021-08-25 DIAGNOSIS — C61 Malignant neoplasm of prostate: Secondary | ICD-10-CM | POA: Diagnosis not present

## 2021-08-26 ENCOUNTER — Ambulatory Visit
Admission: RE | Admit: 2021-08-26 | Discharge: 2021-08-26 | Disposition: A | Discharge status: Home / Self Care | Payer: Medicare Other | Source: Ambulatory Visit | Attending: Radiation Oncology | Admitting: Radiation Oncology

## 2021-08-26 DIAGNOSIS — C61 Malignant neoplasm of prostate: Secondary | ICD-10-CM | POA: Diagnosis not present

## 2021-08-27 ENCOUNTER — Ambulatory Visit
Admission: RE | Admit: 2021-08-27 | Discharge: 2021-08-27 | Disposition: A | Discharge status: Home / Self Care | Payer: Medicare Other | Source: Ambulatory Visit | Attending: Radiation Oncology | Admitting: Radiation Oncology

## 2021-08-27 DIAGNOSIS — C61 Malignant neoplasm of prostate: Secondary | ICD-10-CM | POA: Diagnosis not present

## 2021-09-01 ENCOUNTER — Ambulatory Visit
Admission: RE | Admit: 2021-09-01 | Discharge: 2021-09-01 | Disposition: A | Discharge status: Home / Self Care | Payer: Medicare Other | Source: Ambulatory Visit | Attending: Radiation Oncology | Admitting: Radiation Oncology

## 2021-09-01 DIAGNOSIS — C61 Malignant neoplasm of prostate: Secondary | ICD-10-CM | POA: Diagnosis not present

## 2021-09-02 ENCOUNTER — Ambulatory Visit
Admission: RE | Admit: 2021-09-02 | Discharge: 2021-09-02 | Disposition: A | Discharge status: Home / Self Care | Payer: Medicare Other | Source: Ambulatory Visit | Attending: Radiation Oncology | Admitting: Radiation Oncology

## 2021-09-02 DIAGNOSIS — C61 Malignant neoplasm of prostate: Secondary | ICD-10-CM | POA: Diagnosis not present

## 2021-09-03 ENCOUNTER — Ambulatory Visit
Admission: RE | Admit: 2021-09-03 | Discharge: 2021-09-03 | Disposition: A | Discharge status: Home / Self Care | Payer: Medicare Other | Source: Ambulatory Visit | Attending: Radiation Oncology | Admitting: Radiation Oncology

## 2021-09-03 ENCOUNTER — Inpatient Hospital Stay: Payer: Medicare Other

## 2021-09-03 DIAGNOSIS — C61 Malignant neoplasm of prostate: Secondary | ICD-10-CM | POA: Diagnosis not present

## 2021-09-04 ENCOUNTER — Ambulatory Visit
Admission: RE | Admit: 2021-09-04 | Discharge: 2021-09-04 | Disposition: A | Discharge status: Home / Self Care | Payer: Medicare Other | Source: Ambulatory Visit | Attending: Radiation Oncology | Admitting: Radiation Oncology

## 2021-09-04 DIAGNOSIS — C61 Malignant neoplasm of prostate: Secondary | ICD-10-CM | POA: Insufficient documentation

## 2021-09-04 DIAGNOSIS — Z510 Encounter for antineoplastic radiation therapy: Secondary | ICD-10-CM | POA: Insufficient documentation

## 2021-09-05 ENCOUNTER — Ambulatory Visit
Admission: RE | Admit: 2021-09-05 | Discharge: 2021-09-05 | Disposition: A | Discharge status: Home / Self Care | Payer: Medicare Other | Source: Ambulatory Visit | Attending: Radiation Oncology | Admitting: Radiation Oncology

## 2021-09-05 DIAGNOSIS — C61 Malignant neoplasm of prostate: Secondary | ICD-10-CM | POA: Diagnosis not present

## 2021-09-08 ENCOUNTER — Ambulatory Visit
Admission: RE | Admit: 2021-09-08 | Discharge: 2021-09-08 | Disposition: A | Discharge status: Home / Self Care | Payer: Medicare Other | Source: Ambulatory Visit | Attending: Radiation Oncology | Admitting: Radiation Oncology

## 2021-09-08 DIAGNOSIS — C61 Malignant neoplasm of prostate: Secondary | ICD-10-CM | POA: Diagnosis not present

## 2021-09-09 ENCOUNTER — Ambulatory Visit
Admission: RE | Admit: 2021-09-09 | Discharge: 2021-09-09 | Disposition: A | Discharge status: Home / Self Care | Payer: Medicare Other | Source: Ambulatory Visit | Attending: Radiation Oncology | Admitting: Radiation Oncology

## 2021-09-09 DIAGNOSIS — C61 Malignant neoplasm of prostate: Secondary | ICD-10-CM | POA: Diagnosis not present

## 2021-09-10 ENCOUNTER — Ambulatory Visit
Admission: RE | Admit: 2021-09-10 | Discharge: 2021-09-10 | Disposition: A | Discharge status: Home / Self Care | Payer: Medicare Other | Source: Ambulatory Visit | Attending: Radiation Oncology | Admitting: Radiation Oncology

## 2021-09-10 DIAGNOSIS — C61 Malignant neoplasm of prostate: Secondary | ICD-10-CM | POA: Diagnosis not present

## 2021-09-11 ENCOUNTER — Ambulatory Visit
Admission: RE | Admit: 2021-09-11 | Discharge: 2021-09-11 | Disposition: A | Discharge status: Home / Self Care | Payer: Medicare Other | Source: Ambulatory Visit | Attending: Radiation Oncology | Admitting: Radiation Oncology

## 2021-09-11 DIAGNOSIS — C61 Malignant neoplasm of prostate: Secondary | ICD-10-CM | POA: Diagnosis not present

## 2021-09-12 ENCOUNTER — Ambulatory Visit
Admission: RE | Admit: 2021-09-12 | Discharge: 2021-09-12 | Disposition: A | Discharge status: Home / Self Care | Payer: Medicare Other | Source: Ambulatory Visit | Attending: Radiation Oncology | Admitting: Radiation Oncology

## 2021-09-12 DIAGNOSIS — C61 Malignant neoplasm of prostate: Secondary | ICD-10-CM | POA: Diagnosis not present

## 2021-09-15 ENCOUNTER — Ambulatory Visit
Admission: RE | Admit: 2021-09-15 | Discharge: 2021-09-15 | Disposition: A | Discharge status: Home / Self Care | Payer: Medicare Other | Source: Ambulatory Visit | Attending: Radiation Oncology | Admitting: Radiation Oncology

## 2021-09-15 DIAGNOSIS — C61 Malignant neoplasm of prostate: Secondary | ICD-10-CM | POA: Diagnosis not present

## 2021-09-16 ENCOUNTER — Ambulatory Visit
Admission: RE | Admit: 2021-09-16 | Discharge: 2021-09-16 | Disposition: A | Discharge status: Home / Self Care | Payer: Medicare Other | Source: Ambulatory Visit | Attending: Radiation Oncology | Admitting: Radiation Oncology

## 2021-09-16 DIAGNOSIS — C61 Malignant neoplasm of prostate: Secondary | ICD-10-CM | POA: Diagnosis not present

## 2021-09-17 ENCOUNTER — Ambulatory Visit
Admission: RE | Admit: 2021-09-17 | Discharge: 2021-09-17 | Disposition: A | Discharge status: Home / Self Care | Payer: Medicare Other | Source: Ambulatory Visit | Attending: Radiation Oncology | Admitting: Radiation Oncology

## 2021-09-17 DIAGNOSIS — C61 Malignant neoplasm of prostate: Secondary | ICD-10-CM | POA: Diagnosis not present

## 2021-09-18 ENCOUNTER — Ambulatory Visit: Payer: Medicare Other

## 2021-09-18 ENCOUNTER — Ambulatory Visit
Admission: RE | Admit: 2021-09-18 | Discharge: 2021-09-18 | Disposition: A | Discharge status: Home / Self Care | Payer: Medicare Other | Source: Ambulatory Visit | Attending: Radiation Oncology | Admitting: Radiation Oncology

## 2021-09-18 DIAGNOSIS — C61 Malignant neoplasm of prostate: Secondary | ICD-10-CM | POA: Diagnosis not present

## 2021-09-19 ENCOUNTER — Ambulatory Visit
Admission: RE | Admit: 2021-09-19 | Discharge: 2021-09-19 | Disposition: A | Discharge status: Home / Self Care | Payer: Medicare Other | Source: Ambulatory Visit | Attending: Radiation Oncology | Admitting: Radiation Oncology

## 2021-09-19 DIAGNOSIS — C61 Malignant neoplasm of prostate: Secondary | ICD-10-CM | POA: Diagnosis not present

## 2021-10-30 ENCOUNTER — Ambulatory Visit: Payer: Medicare Other | Admitting: Radiation Oncology

## 2021-11-03 ENCOUNTER — Other Ambulatory Visit: Payer: Self-pay | Admitting: *Deleted

## 2021-11-03 ENCOUNTER — Other Ambulatory Visit: Payer: Self-pay

## 2021-11-03 ENCOUNTER — Ambulatory Visit
Admission: RE | Admit: 2021-11-03 | Discharge: 2021-11-03 | Disposition: A | Discharge status: Home / Self Care | Payer: Medicare Other | Source: Ambulatory Visit | Attending: Radiation Oncology | Admitting: Radiation Oncology

## 2021-11-03 VITALS — BP 178/78 | HR 77 | Temp 97.0°F | Resp 18 | Wt 139.9 lb

## 2021-11-03 DIAGNOSIS — R197 Diarrhea, unspecified: Secondary | ICD-10-CM | POA: Diagnosis not present

## 2021-11-03 DIAGNOSIS — Z923 Personal history of irradiation: Secondary | ICD-10-CM | POA: Diagnosis not present

## 2021-11-03 DIAGNOSIS — C61 Malignant neoplasm of prostate: Secondary | ICD-10-CM

## 2021-11-03 NOTE — Progress Notes (Signed)
Radiation Oncology Follow up Note  Name: Russell Holmes   Date:   11/03/2021 MRN:  [id number] DOB: [birth year]    This 81 y.o. male presents to the clinic today for 1 month follow-up status post IMRT radiation therapy for stage IIIc adenocarcinoma the prostate.  And pelvic nodes for Gleason 9 (4+5) presenting with a PSA of 11  REFERRING PROVIDER: Donnamarie Rossetti,*  HPI: Patient is a 81 year old male now at 1 month having completed IMRT radiation therapy to his prostate and pelvic nodes for Gleason 9 (4+5) adenocarcinoma the prostate presenting with a PSA of 11.  He is seen today in routine follow-up he is still having some urinary leakage although this precedes his radiation.  He is also having intermittent diarrhea.  He is following no low residual diet and does not take Imodium.  Also is on no cranberry juice or other medications for his slight urinary incontinence.  He is having no bone pain at this time..  COMPLICATIONS OF TREATMENT: none  FOLLOW UP COMPLIANCE: keeps appointments   PHYSICAL EXAM:  BP (!) 178/78    Pulse 77    Temp (!) 97 F (36.1 C)    Resp 18    Wt 139 lb 14.4 oz (63.5 kg)    BMI 24.01 kg/m  Well-developed well-nourished patient in NAD. HEENT reveals PERLA, EOMI, discs not visualized.  Oral cavity is clear. No oral mucosal lesions are identified. Neck is clear without evidence of cervical or supraclavicular adenopathy. Lungs are clear to A&P. Cardiac examination is essentially unremarkable with regular rate and rhythm without murmur rub or thrill. Abdomen is benign with no organomegaly or masses noted. Motor sensory and DTR levels are equal and symmetric in the upper and lower extremities. Cranial nerves II through XII are grossly intact. Proprioception is intact. No peripheral adenopathy or edema is identified. No motor or sensory levels are noted. Crude visual fields are within normal range.  RADIOLOGY RESULTS: No current films for review  PLAN: Present time  patient is doing fairly well.  Again have emphasized trying cranberry juice as well as Imodium for his diarrhea.  Of asked to see him back in 3 months with a PSA at that time.  Patient also has follow-up with [facility] urology will be on ADT therapy for at least 2 to 3 years.  Patient and wife both seem to comprehend my recommendations well.  I would like to take this opportunity to thank you for allowing me to participate in the care of your patient.Noreene Filbert, MD

## 2022-01-02 ENCOUNTER — Other Ambulatory Visit: Payer: Medicare Other

## 2022-01-02 DIAGNOSIS — C61 Malignant neoplasm of prostate: Secondary | ICD-10-CM

## 2022-01-03 LAB — PSA: Prostate Specific Ag, Serum: 0.4 ng/mL (ref 0.0–4.0)

## 2022-01-08 ENCOUNTER — Ambulatory Visit: Payer: Medicare Other | Admitting: Urology

## 2022-01-08 ENCOUNTER — Encounter: Payer: Self-pay | Admitting: Urology

## 2022-01-08 VITALS — BP 165/80 | HR 92 | Ht 64.0 in | Wt 138.0 lb

## 2022-01-08 DIAGNOSIS — C61 Malignant neoplasm of prostate: Secondary | ICD-10-CM

## 2022-01-08 DIAGNOSIS — N3281 Overactive bladder: Secondary | ICD-10-CM

## 2022-01-08 MED ORDER — LEUPROLIDE ACETATE (6 MONTH) 45 MG ~~LOC~~ KIT
45.0000 mg | PACK | Freq: Once | SUBCUTANEOUS | Status: AC
  Administered 2022-01-08: 45 mg via SUBCUTANEOUS

## 2022-01-08 NOTE — Progress Notes (Signed)
? ?  01/08/2022 ?9:16 AM  ? ?Russell Holmes ?1940/11/13 ?[id number] ? ?Reason for visit: Follow up high risk prostate cancer, LUTS ? ?HPI: ?81 year old male diagnosed with high risk prostate cancer including 4+5= 9 with 95% core involvement, 20 g prostate who underwent external beam radiation(completed December 2022) with plan for 2 to 3 years of ADT total.  Initial dose of ADT given on 07/10/2021. ? ?He did well with radiation.  Urinary symptoms overall are stable, and he has some baseline urgency and frequency, with rare urge incontinence.  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  I counseled him about the relationship between alcohol and OAB symptom, as well as overall health. ? ?PSA has dropped appropriately, most recently 0.4 on 01/02/2022. ? ?We discussed the importance of monitoring the PSA trend, and continuing 2 years ADT. ? ?RTC 6 months PSA prior ?Continue flomax ?Continue ADT for 2 years(07/2021-07/2023) ? ?Billey Co, MD ? ?[facility] ?[address] ?[place], [place] [address] ?[phone] ? ? ?

## 2022-01-08 NOTE — Progress Notes (Signed)
Patient ID: Russell Holmes, male   DOB: 1941/05/24, 81 y.o.   MRN: [id number] ?Eligard SubQ Injection  ? ?Due to Prostate Cancer patient is present today for a Eligard Injection. ? ?Medication: Eligard 6 month ?Dose: 45 mg  ?Location: right  ?Lot: [id number] ?Exp: 08/05/20 ? ?Patient tolerated well, no complications were noted ? ?Performed by: Edwin Dada, CMA ? ?Per Dr. Diamantina Providence patient is to continue therapy for 2 years . Patient's next follow up was scheduled for October. This appointment was scheduled using wheel and given to patient today along with reminder continue on Vitamin D 800-1000iu and Calcium 1000-'1200mg'$  daily while on Androgen Deprivation Therapy.  ?PA approval dates:  ? ?

## 2022-01-08 NOTE — Addendum Note (Signed)
Addended by: Despina Hidden on: 01/08/2022 09:51 AM ? ? Modules accepted: Orders ? ?

## 2022-01-28 ENCOUNTER — Ambulatory Visit
Admission: RE | Admit: 2022-01-28 | Discharge: 2022-01-28 | Disposition: A | Discharge status: Home / Self Care | Payer: Medicare Other | Source: Ambulatory Visit | Attending: Radiation Oncology | Admitting: Radiation Oncology

## 2022-01-28 ENCOUNTER — Encounter: Payer: Self-pay | Admitting: Radiation Oncology

## 2022-01-28 VITALS — BP 166/80 | HR 79 | Temp 97.1°F | Resp 16 | Ht 64.0 in | Wt 140.1 lb

## 2022-01-28 DIAGNOSIS — C61 Malignant neoplasm of prostate: Secondary | ICD-10-CM | POA: Insufficient documentation

## 2022-01-28 DIAGNOSIS — Z923 Personal history of irradiation: Secondary | ICD-10-CM | POA: Diagnosis not present

## 2022-01-28 DIAGNOSIS — C775 Secondary and unspecified malignant neoplasm of intrapelvic lymph nodes: Secondary | ICD-10-CM | POA: Diagnosis not present

## 2022-01-28 NOTE — Progress Notes (Signed)
Radiation Oncology ?Follow up Note ? ?Name: Russell Holmes   ?Date:   01/28/2022 ?MRN:  [id number] ?DOB: 04-Nov-1940  ? ? ?This 81 y.o. 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 ? ?REFERRING PROVIDER: Donnamarie Rossetti,* ? ?HPI: Patient is a 81 year old male now out 5 months having completed IMRT radiation therapy to his prostate and pelvic nodes for Gleason 9 adenocarcinoma presenting with a PSA of 11.  Seen today in routine follow-up he is doing well.  Specifically denies any increased lower urinary tract symptoms diarrhea or fatigue.  His most recent PSA is 0.4. ? ?COMPLICATIONS OF TREATMENT: none ? ?FOLLOW UP COMPLIANCE: keeps appointments  ? ?PHYSICAL EXAM:  ?BP (!) 166/80 (BP Location: Left Arm, Patient Position: Sitting)   Pulse 79   Temp (!) 97.1 ?F (36.2 ?C) (Tympanic)   Resp 16   Ht '5\' 4"'$  (1.626 m)   Wt 140 lb 1.6 oz (63.5 kg)   BMI 24.05 kg/m?  ?Well-developed well-nourished patient in NAD. HEENT reveals PERLA, EOMI, discs not visualized.  Oral cavity is clear. No oral mucosal lesions are identified. Neck is clear without evidence of cervical or supraclavicular adenopathy. Lungs are clear to A&P. Cardiac examination is essentially unremarkable with regular rate and rhythm without murmur rub or thrill. Abdomen is benign with no organomegaly or masses noted. Motor sensory and DTR levels are equal and symmetric in the upper and lower extremities. Cranial nerves II through XII are grossly intact. Proprioception is intact. No peripheral adenopathy or edema is identified. No motor or sensory levels are noted. Crude visual fields are within normal range. ? ?RADIOLOGY RESULTS: No current films to review ? ?PLAN: Present time patient is under excellent biochemical control of his prostate cancer remains on ADT therapy.  He has a very low side effect profile.  Of  asked to see him back in 6 months for follow-up with a PSA at that time.  Patient knows to call with any concerns. ? ?I would like to take this opportunity to thank you for allowing me to participate in the care of your patient.. ?  ? Noreene Filbert, MD ? ?

## 2022-02-20 ENCOUNTER — Other Ambulatory Visit: Payer: Medicare Other

## 2022-02-25 ENCOUNTER — Ambulatory Visit: Payer: Medicare Other | Admitting: Radiation Oncology

## 2022-03-30 ENCOUNTER — Other Ambulatory Visit: Payer: Self-pay

## 2022-03-30 DIAGNOSIS — N138 Other obstructive and reflux uropathy: Secondary | ICD-10-CM

## 2022-03-30 DIAGNOSIS — C61 Malignant neoplasm of prostate: Secondary | ICD-10-CM

## 2022-03-30 MED ORDER — TAMSULOSIN HCL 0.4 MG PO CAPS: 0.4000 mg | ORAL_CAPSULE | Freq: Every day | ORAL | 3 refills | Status: DC

## 2022-06-18 ENCOUNTER — Telehealth: Payer: Self-pay

## 2022-06-18 NOTE — Telephone Encounter (Signed)
Prior authorization for Eligard approved.   Auth# [id number]  Dates: 07/03/22 - 07/04/23

## 2022-07-06 ENCOUNTER — Other Ambulatory Visit: Payer: Medicare Other

## 2022-07-06 DIAGNOSIS — N3281 Overactive bladder: Secondary | ICD-10-CM

## 2022-07-06 DIAGNOSIS — C61 Malignant neoplasm of prostate: Secondary | ICD-10-CM

## 2022-07-07 LAB — PSA: Prostate Specific Ag, Serum: <0.1 ng/mL (ref 0.0–4.0)

## 2022-07-09 ENCOUNTER — Ambulatory Visit
Admission: RE | Admit: 2022-07-09 | Discharge: 2022-07-09 | Disposition: A | Discharge status: Home / Self Care | Payer: Medicare Other | Source: Ambulatory Visit | Attending: Radiation Oncology | Admitting: Radiation Oncology

## 2022-07-09 ENCOUNTER — Ambulatory Visit: Payer: Medicare Other | Admitting: Urology

## 2022-07-09 ENCOUNTER — Other Ambulatory Visit: Payer: Self-pay | Admitting: *Deleted

## 2022-07-09 ENCOUNTER — Encounter: Payer: Self-pay | Admitting: Radiation Oncology

## 2022-07-09 VITALS — BP 151/80 | HR 77 | Temp 97.1°F | Resp 16 | Wt 140.5 lb

## 2022-07-09 VITALS — BP 175/81 | HR 86 | Ht 64.0 in | Wt 138.0 lb

## 2022-07-09 DIAGNOSIS — R3915 Urgency of urination: Secondary | ICD-10-CM

## 2022-07-09 DIAGNOSIS — R351 Nocturia: Secondary | ICD-10-CM | POA: Diagnosis not present

## 2022-07-09 DIAGNOSIS — Z923 Personal history of irradiation: Secondary | ICD-10-CM | POA: Diagnosis not present

## 2022-07-09 DIAGNOSIS — R350 Frequency of micturition: Secondary | ICD-10-CM | POA: Diagnosis not present

## 2022-07-09 DIAGNOSIS — R399 Unspecified symptoms and signs involving the genitourinary system: Secondary | ICD-10-CM

## 2022-07-09 DIAGNOSIS — C61 Malignant neoplasm of prostate: Secondary | ICD-10-CM | POA: Insufficient documentation

## 2022-07-09 MED ORDER — LEUPROLIDE ACETATE (6 MONTH) 45 MG ~~LOC~~ KIT
45.0000 mg | PACK | Freq: Once | SUBCUTANEOUS | Status: AC
  Administered 2022-07-09: 45 mg via SUBCUTANEOUS

## 2022-07-09 NOTE — Progress Notes (Signed)
Patient ID: Russell Holmes, male   DOB: [birth year], 81 y.o.   MRN: [id number] Eligard SubQ Injection   Due to Prostate Cancer patient is present today for a Eligard Injection.  Medication: Eligard 6 month Dose: 45 mg  Location: right  Lot: [id number]  Exp: [id number]   Patient tolerated well, no complications were noted  Performed by: Edwin Dada, [place]  Per Dr. Diamantina Providence patient is to continue therapy for 6 months . Patient's next follow up was scheduled for 01/07/2023. This appointment was scheduled using wheel and given to patient today along with reminder continue on Vitamin D 800-1000iu and Calcium 1000-'1200mg'$  daily while on Androgen Deprivation Therapy.  PA approval dates:

## 2022-07-09 NOTE — Progress Notes (Signed)
Radiation Oncology Follow up Note  Name: Russell Holmes   Date:   07/09/2022 MRN:  [id number] DOB: [birth year]    This 81 y.o. male presents to the clinic today for 43-monthfollow-up status post IMRT radiation therapy to his prostate for stage IIIc adenocarcinoma Gleason 9 (4+5) presenting with a PSA of 11.  REFERRING PROVIDER: WDonnamarie Rossetti*  HPI: Patient is an 81year old male now at 11 months having completed IMRT radiation therapy to his prostate and pelvic nodes for stage IIIc adenocarcinoma.  Seen today in routine follow-up he is doing well he does have some urgency of urination nocturia x2.  He is having no bowel problems.  His most recent PSA is less than 0.1 he is currently on ADT therapy through urology COMPLICATIONS OF TREATMENT: none  FOLLOW UP COMPLIANCE: keeps appointments   PHYSICAL EXAM:  BP (!) 151/80 (BP Location: Left Arm, Patient Position: Sitting)   Pulse 77   Temp (!) 97.1 F (36.2 C) (Tympanic)   Resp 16   Wt 140 lb 8 oz (63.7 kg)   BMI 24.12 kg/m  Well-developed well-nourished patient in NAD. HEENT reveals PERLA, EOMI, discs not visualized.  Oral cavity is clear. No oral mucosal lesions are identified. Neck is clear without evidence of cervical or supraclavicular adenopathy. Lungs are clear to A&P. Cardiac examination is essentially unremarkable with regular rate and rhythm without murmur rub or thrill. Abdomen is benign with no organomegaly or masses noted. Motor sensory and DTR levels are equal and symmetric in the upper and lower extremities. Cranial nerves II through XII are grossly intact. Proprioception is intact. No peripheral adenopathy or edema is identified. No motor or sensory levels are noted. Crude visual fields are within normal range.  RADIOLOGY RESULTS: No current films for review  PLAN: Present time patient is under excellent biochemical control of his prostate cancer he remains in ED ADT therapy for 2 years at least.  I am pleased with  his overall progress I have asked to see him back in 6 months for follow-up with a repeat PSA.  Patient is to call any concerns.  He continues close follow-up care through urology.  I would like to take this opportunity to thank you for allowing me to participate in the care of your patient..Noreene Filbert MD

## 2022-07-09 NOTE — Progress Notes (Signed)
   07/09/2022 10:23 AM   Russell Holmes June 25, 1941 [id number]  Reason for visit: Follow up high risk prostate cancer, LUTS  HPI: 81 year old male diagnosed with high risk prostate cancer including 4+5= 9 with 95% core involvement, 20 g prostate, no evidence of metastatic disease who underwent external beam radiation(completed December 2022) with plan for 2 years of ADT total.  Initial dose of ADT given on 07/10/2021.  He did well with radiation.  Urinary symptoms overall are stable, and he has some baseline urgency and frequency, with rare urge incontinence.  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  I counseled him about the relationship between alcohol and OAB symptom, as well as overall health.  PSA has dropped appropriately, most recently undetectable on 07/06/2022  We discussed the importance of monitoring the PSA trend, and continuing 2 years ADT.  42-monthADT injection given today, see nursing note.  RTC 6 months PSA prior Continue flomax, consider discontinuing at next visit Continue ADT for 2 years(07/2021-07/2023)  BBilley Co MD  [place] [place] Springs[place] [phone]

## 2022-07-09 NOTE — Patient Instructions (Signed)
Informacin sobre el consumo indebido y la dependencia del alcohol en los adultos Alcohol Misuse and Dependence Information, Adult El alcohol es una droga que se consigue con facilidad y las personas eligen beber alcohol en diferentes cantidades. El consumo indebido y la dependencia del alcohol pueden tener un efecto negativo en su vida. El consumo indebido del alcohol se refiere a Dance movement psychotherapist cantidad de alcohol o beber con mucha frecuencia. Es posible que tenga dificultades para establecer un lmite en la cantidad que bebe. La dependencia del alcohol se refiere a beber alcohol de forma continua durante un perodo determinado, y [place] resultado, el cuerpo sufre cambios. La dependencia del alcohol hace que le sea difcil dejar de beber porque puede empezar a sentirse enfermo o diferente si no bebe alcohol. Estos sntomas se conocen como abstinencia. Las personas que beben alcohol con mucha frecuencia y en grandes cantidades pueden desarrollar lo que se denomina "trastorno por consumo de alcohol". Cmo pueden afectarme el consumo indebido y la dependencia del alcohol? Beber demasiado puede causar adiccin. Puede sentir que necesita el alcohol para Mudlogger. Puede beber alcohol antes de ir al trabajo por la maana, durante el da o tan pronto como llega a su casa del trabajo por la noche. Estas acciones pueden causar lo siguiente: Desempeo laboral deficiente. Prdida del trabajo. Problemas financieros. Accidentes automovilsticos o cargos penales por conducir despus de beber alcohol. Problemas en [place] y familiares. Prdida de la confianza y del respeto de compaeros de [place], amigos y familiares. El consumo excesivo de alcohol durante un perodo prolongado puede daar el cuerpo y el cerebro de forma [place], y puede causar problemas de salud de por vida, como por ejemplo: Dao en el hgado o el pncreas. Problemas cardacos, hipertensin  arterial o accidentes cerebrovasculares. Ciertos tipos de cncer. Reduccin de la capacidad para combatir las infecciones. Dao cerebral o nervioso. Depresin. Muerte temprana, tambin llamada muerte prematura. Si no tiene cuidado o si siente un deseo intenso de beber alcohol, es fcil beber ms de lo que el cuerpo IT consultant (sobredosis). La sobredosis de alcohol es una situacin grave que requiere hospitalizacin. Puede producir lesiones permanentes o la muerte. [place]? Tener antecedentes familiares de consumo indebido de alcohol. Tener depresin u otras afecciones de [place] a beber a Financial controller. Consumir alcohol de manera compulsiva y con frecuencia. Experimentar traumatismos, estrs y [place] vida familiar inestable durante la infancia. Pasar tiempo con personas que beben con frecuencia. Qu medidas puedo tomar para prevenir el consumo indebido y la dependencia del alcohol? No beba alcohol si: Su mdico le indica no hacerlo. Est embarazada, puede estar embarazada o est tratando de [place]. Si bebe alcohol: Limite la cantidad que bebe a lo siguiente: De 0 a 1 medida por da para las mujeres que no estn embarazadas. De 0 a 2 medidas por da para los hombres. Sepa cunta cantidad de alcohol hay en las bebidas que toma. En los [place], una medida equivale a una botella de cerveza de 12 oz (355 ml), un vaso de vino de 5 oz (148 ml) o un vaso de una bebida alcohlica de alta graduacin de 1 oz (44 ml). Si cree que tiene un problema de dependencia de alcohol, decdase a dejar de beber. Esto puede ser muy difcil de hacer si est acostumbrado a beber alcohol con frecuencia. Si comienza a tener sntomas de abstinencia, hable con su mdico o con una persona en quien  confe. Estos sntomas pueden incluir ansiedad, temblores [employer], dolor de [place], nuseas, sudoracin o imposibilidad para dormir. Elija tomar bebidas sin  alcohol en reuniones sociales y lugares donde puede haber alcohol. Actividad Dedique ms tiempo a las actividades que disfrute que no incluyan el alcohol, como pasatiempos o [place] fsica. Busque maneras saludables de Scientist, research (medical), Loss adjuster, chartered o pasar tiempo con las personas que ms quiere. Informacin general Hable con familiares, compaeros de trabajo o amigos y pdales que lo apoyen en sus esfuerzos para dejar de beber. Si beben, pdales que no beban a su alrededor. Pase ms tiempo con personas que no beben alcohol. Si cree que tiene un problema de dependencia de alcohol: [place] a sus amigos o familiares acerca de sus inquietudes. Hable con su mdico u otro profesional de la salud sobre dnde [place]. Trabaje con un terapeuta y un consejero de dependencia qumica. Considere unirse a un grupo de apoyo para personas que luchan contra el consumo indebido y la dependencia del alcohol. Dnde obtener apoyo  Su mdico. SMART Recovery: smartrecovery.org Centros locales de tratamiento o consejeros de dependencia qumica. Grupos locales de AA en su comunidad: ShedSizes.ch Dnde obtener ms informacin Centers for Disease Control and Prevention (Centros para Building surveyor y Publishing copy de Arboriculturist): StoreMirror.com.cy [employer] on Alcohol Abuse and Alcoholism (Keo de Consumo Indebido de Alcohol y Alcoholismo): LinkVoyage.dk Alcoholics Anonymous (Alcohlicos Annimos, AA): NicTax.com.pt Comunquese con un mdico si: Bebi ms o por ms tiempo de lo previsto, en ms de una ocasin. Bebe a menudo hasta el punto de vomitar o perder la conciencia. Tiene problemas en su vida debido a la bebida, pero contina bebiendo. Contina bebiendo aunque se siente ansioso, deprimido o ha experimentado una prdida de Astronomer. Dej de hacer las cosas que sola disfrutar para beber alcohol. Debe beber ms de lo que sola para Control and instrumentation engineer que desea. Tiene ansiedad, sudoracin,  nuseas, temblores y problemas para dormir cuando trata de dejar de beber. Solicite ayuda de inmediato si: Tiene sntomas de abstinencia graves, por ejemplo: Confusin. Corazn acelerado. Presin arterial alta. Cristy Hilts. Estos sntomas pueden Sales executive. Solicite ayuda de inmediato. Llame al 911. No espere a ver si los sntomas desaparecen. No conduzca por sus propios medios Principal Financial. Solicite ayuda de inmediato tambin si: Tiene pensamientos acerca de Glass blower/designer o lastimar a Producer, television/film/video. D uno de estos pasos si siente que puede lastimarse a s mismo o a Producer, television/film/video, o tiene pensamientos de poner fin a su vida: Llame al 911. Llame a National Suicide Prevention Lifeline ([place] para la Prevencin del Suicidio) al [phone] o al 988. Est disponible las 24 horas del da. Enve un mensaje de texto a la lnea para casos de crisis al [phone]. Resumen El consumo indebido y la dependencia del alcohol pueden tener un efecto negativo en su vida. Beber demasiado o con demasiada frecuencia puede causar adiccin. Si bebe alcohol, limite la cantidad que consume. Si tiene dificultad para Theatre manager su consumo de alcohol bajo control, busque formas de [place]. Los pasatiempos, algunas actividades tranquilas, el ejercicio o los grupos de apoyo pueden Air traffic controller. Si cree que necesita ayuda para cambiar sus hbitos de consumo de alcohol, hable con su mdico, un buen amigo o un terapeuta, o vaya a un grupo de apoyo. Esta informacin no tiene Marine scientist el consejo del mdico. Asegrese de hacerle al mdico cualquier pregunta que tenga. Document Revised: 12/23/2021 Document Reviewed: 12/23/2021 Elsevier Patient Education  2023 Elsevier Inc.  

## 2023-01-04 ENCOUNTER — Other Ambulatory Visit: Payer: Medicare Other

## 2023-01-06 ENCOUNTER — Inpatient Hospital Stay: Payer: Medicare Other | Attending: Radiation Oncology

## 2023-01-06 DIAGNOSIS — Z923 Personal history of irradiation: Secondary | ICD-10-CM | POA: Insufficient documentation

## 2023-01-06 DIAGNOSIS — C61 Malignant neoplasm of prostate: Secondary | ICD-10-CM | POA: Diagnosis not present

## 2023-01-06 LAB — PSA: Prostatic Specific Antigen: 0.02 ng/mL (ref 0.00–4.00)

## 2023-01-07 ENCOUNTER — Encounter: Payer: Self-pay | Admitting: Urology

## 2023-01-07 ENCOUNTER — Ambulatory Visit: Payer: Medicare Other | Admitting: Urology

## 2023-01-07 VITALS — BP 150/84 | HR 82 | Ht 64.0 in | Wt 142.0 lb

## 2023-01-07 DIAGNOSIS — R399 Unspecified symptoms and signs involving the genitourinary system: Secondary | ICD-10-CM | POA: Diagnosis not present

## 2023-01-07 DIAGNOSIS — C61 Malignant neoplasm of prostate: Secondary | ICD-10-CM | POA: Diagnosis not present

## 2023-01-07 MED ORDER — LEUPROLIDE ACETATE (6 MONTH) 45 MG ~~LOC~~ KIT
45.0000 mg | PACK | Freq: Once | SUBCUTANEOUS | Status: AC
  Administered 2023-01-07: 45 mg via SUBCUTANEOUS

## 2023-01-07 NOTE — Progress Notes (Signed)
Eligard SubQ Injection   Due to Prostate Cancer patient is present today for a Eligard Injection.  Medication: Eligard 6 month Dose: 45 mg  Location: right arm Lot: [id number] Exp: 11/2023  Patient tolerated well, no complications were noted  Performed by: Remus Loffler, Student CMA, Gordy Clement, [place]   Per Dr. Diamantina Providence patient is to continue therapy for 1 year. Patient's next follow up was scheduled for September 2024. This appointment was scheduled using wheel and given to patient today along with reminder continue on Vitamin D 800-1000iu and Calcium 1000-1200mg  daily while on Androgen Deprivation Therapy.  PA approval dates:  07/03/22 - 07/04/23

## 2023-01-07 NOTE — Progress Notes (Signed)
   01/07/2023 12:45 PM   Russell Holmes 05/25/1941 [id number]  Reason for visit: Follow up high risk prostate cancer, LUTS  HPI: 82 year old male diagnosed with high risk prostate cancer including 4+5= 9 with 95% core involvement, 20 g prostate, no evidence of metastatic disease who underwent external beam radiation(completed December 2022) with plan for 2 years of ADT total.  Initial dose of ADT given on 07/10/2021.  He did well with radiation.  Urinary symptoms overall are stable, and he has some baseline urgency and frequency, with rare urge incontinence.  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  I counseled him about the relationship between alcohol and OAB symptom, as well as overall health.  His primary urinary symptoms today are frequency.  He is interested if he can stop the Flomax and I think that is reasonable, he can always resume this if he has worsening urinary symptoms.  PSA has dropped appropriately, most recently undetectable on 0.02 on 01/06/2023.  We discussed the importance of monitoring the PSA trend, and continuing 2 years ADT.  25-month ADT injection given today, see nursing note.  Plan for this to be final ADT injection today unless PSA rises in the future.  RTC 6 months PSA prior Flomax discontinued, can resume if worsening urinary symptoms  Billey Co, MD  [place] [address] [place], [place] [address] [phone]

## 2023-01-11 ENCOUNTER — Ambulatory Visit: Payer: Medicare Other | Admitting: Radiation Oncology

## 2023-01-11 DIAGNOSIS — Z923 Personal history of irradiation: Secondary | ICD-10-CM | POA: Insufficient documentation

## 2023-01-11 DIAGNOSIS — C775 Secondary and unspecified malignant neoplasm of intrapelvic lymph nodes: Secondary | ICD-10-CM | POA: Insufficient documentation

## 2023-01-11 DIAGNOSIS — C61 Malignant neoplasm of prostate: Secondary | ICD-10-CM | POA: Insufficient documentation

## 2023-01-21 ENCOUNTER — Encounter: Payer: Self-pay | Admitting: Radiation Oncology

## 2023-01-21 ENCOUNTER — Ambulatory Visit
Admission: RE | Admit: 2023-01-21 | Discharge: 2023-01-21 | Disposition: A | Discharge status: Home / Self Care | Payer: Medicare Other | Source: Ambulatory Visit | Attending: Radiation Oncology | Admitting: Radiation Oncology

## 2023-01-21 VITALS — BP 144/80 | HR 88 | Temp 97.4°F | Resp 16 | Ht 64.0 in | Wt 142.7 lb

## 2023-01-21 DIAGNOSIS — Z923 Personal history of irradiation: Secondary | ICD-10-CM | POA: Diagnosis not present

## 2023-01-21 DIAGNOSIS — C61 Malignant neoplasm of prostate: Secondary | ICD-10-CM | POA: Diagnosis not present

## 2023-01-21 DIAGNOSIS — C775 Secondary and unspecified malignant neoplasm of intrapelvic lymph nodes: Secondary | ICD-10-CM | POA: Diagnosis present

## 2023-01-21 NOTE — Progress Notes (Signed)
Radiation Oncology Follow up Note  Name: Russell Holmes   Date:   01/21/2023 MRN:  [id number] DOB: 1941/08/13    This 82 y.o. male presents to the clinic today for 26-month follow-up status post IMRT radiation therapy to his prostate for stage IIIc Gleason 9 (4+5) adenocarcinoma presenting with a PSA of 11.  REFERRING PROVIDER: Wilford Corner,*  HPI: Patient is an 82 year old male now out 70 months having completed IMRT radiation therapy to his prostate and pelvic nodes for Gleason 9 (4+5) adenocarcinoma presenting with a PSA of 11.  Seen today in routine follow-up he is doing well.  He specifically denies any increased lower urinary tract symptoms diarrhea or fatigue.  His most recent PSA this month was 0.02.Marland Kitchen  He is currently on ADT therapy being managed by urology.  He is recently been taken off of his Flomax.  COMPLICATIONS OF TREATMENT: none  FOLLOW UP COMPLIANCE: keeps appointments   PHYSICAL EXAM:  BP (!) 144/80   Pulse 88   Temp (!) 97.4 F (36.3 C)   Resp 16   Ht 5\' 4"  (1.626 m)   Wt 142 lb 11.2 oz (64.7 kg)   BMI 24.49 kg/m  Well-developed well-nourished patient in NAD. HEENT reveals PERLA, EOMI, discs not visualized.  Oral cavity is clear. No oral mucosal lesions are identified. Neck is clear without evidence of cervical or supraclavicular adenopathy. Lungs are clear to A&P. Cardiac examination is essentially unremarkable with regular rate and rhythm without murmur rub or thrill. Abdomen is benign with no organomegaly or masses noted. Motor sensory and DTR levels are equal and symmetric in the upper and lower extremities. Cranial nerves II through XII are grossly intact. Proprioception is intact. No peripheral adenopathy or edema is identified. No motor or sensory levels are noted. Crude visual fields are within normal range.  RADIOLOGY RESULTS: No current films for review  PLAN: Present time patient is doing well under excellent biochemical control of his prostate  cancer.  He is still on ADT therapy and probably will be for the next 2 to 3 years.  I have asked to see him back in 1 year for follow-up.  Will be alternating with urology on his checkups.  Patient is to call with any concerns.  I would like to take this opportunity to thank you for allowing me to participate in the care of your patient.Russell Miller, MD

## 2023-06-21 ENCOUNTER — Other Ambulatory Visit: Payer: Self-pay | Admitting: Urology

## 2023-06-21 DIAGNOSIS — N138 Other obstructive and reflux uropathy: Secondary | ICD-10-CM

## 2023-06-21 DIAGNOSIS — C61 Malignant neoplasm of prostate: Secondary | ICD-10-CM

## 2023-06-30 ENCOUNTER — Ambulatory Visit: Payer: Medicare Other | Admitting: Urology

## 2023-07-02 ENCOUNTER — Other Ambulatory Visit: Payer: Self-pay

## 2023-07-02 DIAGNOSIS — C61 Malignant neoplasm of prostate: Secondary | ICD-10-CM

## 2023-07-05 ENCOUNTER — Other Ambulatory Visit: Payer: Medicare Other

## 2023-07-05 DIAGNOSIS — C61 Malignant neoplasm of prostate: Secondary | ICD-10-CM

## 2023-07-06 LAB — PSA: Prostate Specific Ag, Serum: <0.1 ng/mL (ref 0.0–4.0)

## 2023-07-14 ENCOUNTER — Ambulatory Visit: Payer: Medicare Other | Admitting: Urology

## 2023-07-19 ENCOUNTER — Telehealth: Payer: Self-pay

## 2023-07-19 NOTE — Telephone Encounter (Signed)
Incoming approval from [facility] for [employer] #[id number]  07/19/2023 - 07/18/2024

## 2023-08-05 ENCOUNTER — Encounter: Payer: Self-pay | Admitting: Urology

## 2023-08-05 ENCOUNTER — Ambulatory Visit: Payer: Medicare Other | Admitting: Urology

## 2023-08-05 VITALS — BP 168/84 | HR 93 | Ht 64.0 in | Wt 137.4 lb

## 2023-08-05 DIAGNOSIS — C61 Malignant neoplasm of prostate: Secondary | ICD-10-CM

## 2023-08-05 DIAGNOSIS — R399 Unspecified symptoms and signs involving the genitourinary system: Secondary | ICD-10-CM

## 2023-08-05 MED ORDER — LEUPROLIDE ACETATE (6 MONTH) 45 MG ~~LOC~~ KIT
45.0000 mg | PACK | Freq: Once | SUBCUTANEOUS | Status: AC
  Administered 2023-08-05: 45 mg via SUBCUTANEOUS

## 2023-08-05 NOTE — Progress Notes (Signed)
08/05/2023 9:02 AM   Russell Holmes 05/27/1941 [id number]  Reason for visit: Follow up high risk prostate cancer, LUTS   HPI: 82 year old male diagnosed with high risk prostate cancer including 4+5= 9 with 95% core involvement, 20 g prostate, no evidence of metastatic disease who underwent external beam radiation(completed December 2022) with plan for 2-3 years of ADT total.  Initial dose of ADT given on 07/10/2021.   He did well with radiation.  Urinary symptoms overall are stable, and he has some baseline urgency and frequency, with rare urge incontinence.  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  I counseled him about the relationship between alcohol and OAB symptom, as well as overall health.  His primary urinary symptoms today are frequency.  We again reviewed considering a trial off Flomax, and he will consider this.  We discussed he could resume the Flomax if urinary symptoms worsen.   Most recent PSA undetectable and stable from prior.   We discussed the importance of monitoring the PSA trend, and continuing 2-3 years ADT.   35-month ADT injection given today, see nursing note.  Plan for 3 years total with his high risk disease, final ADT injection will be 6 months from now.   RTC 6 months PSA prior Anticipate 3 years ADT, final ADT injection will be April 2024 Flomax discontinued, can resume if worsening urinary symptoms  Sondra Come, MD  [facility] [address], [address] [place], [place] [address] [phone]

## 2023-08-05 NOTE — Progress Notes (Signed)
Eligard SubQ Injection   Due to Prostate Cancer patient is present today for a Eligard Injection.  Medication: Eligard 6 month Dose: 45 mg  Location: Right Arm  Lot: [id number] Exp: 08/05/2024  Patient tolerated well, no complications were noted.  Performed by: Debbe Bales, CMA   Per Dr. Richardo Hanks patient is to continue therapy for 6 months (22yrs total). Patient's next follow up was scheduled for Feb 03, 2024. This appointment was scheduled using wheel and given to patient today along with reminder continue on Vitamin D 800-1000iu and Calcium 1000-1200mg  daily while on Androgen Deprivation Therapy.  PA approval dates: 07/19/23 - 07/18/24

## 2024-01-12 ENCOUNTER — Other Ambulatory Visit: Payer: Self-pay | Admitting: *Deleted

## 2024-01-12 DIAGNOSIS — C61 Malignant neoplasm of prostate: Secondary | ICD-10-CM

## 2024-01-19 ENCOUNTER — Inpatient Hospital Stay: Payer: Medicare Other | Attending: Radiation Oncology

## 2024-01-19 DIAGNOSIS — C61 Malignant neoplasm of prostate: Secondary | ICD-10-CM | POA: Insufficient documentation

## 2024-01-20 LAB — PSA: Prostatic Specific Antigen: 0.02 ng/mL (ref 0.00–4.00)

## 2024-01-26 ENCOUNTER — Encounter: Payer: Self-pay | Admitting: Radiation Oncology

## 2024-01-26 ENCOUNTER — Ambulatory Visit
Admission: RE | Admit: 2024-01-26 | Discharge: 2024-01-26 | Disposition: A | Discharge status: Home / Self Care | Payer: Medicare Other | Source: Ambulatory Visit | Attending: Radiation Oncology | Admitting: Radiation Oncology

## 2024-01-26 VITALS — BP 140/85 | HR 64 | Temp 96.9°F | Resp 14 | Ht 64.0 in | Wt 131.8 lb

## 2024-01-26 DIAGNOSIS — C61 Malignant neoplasm of prostate: Secondary | ICD-10-CM

## 2024-01-26 NOTE — Progress Notes (Signed)
 Radiation Oncology Follow up Note  Name: Russell Holmes   Date:   01/26/2024 MRN:  [id number] DOB: 1941-08-08    This 83 y.o. male presents to the clinic today for 2-1/2-year follow-up status post IMRT radiation therapy to his prostate for stage IIIc Gleason 9 (4+5) adenocarcinoma presenting with a PSA of 11.  REFERRING PROVIDER: Lenell Query,*  HPI: Patient is an 83 year old male now out over 2-1/2 years having completed IMRT radiation therapy to his prostate and pelvic nodes for Gleason 9 (4+5) adenocarcinoma presenting with a PSA of 11.  Seen today in routine follow-up he is doing well.  He specifically denies any increased lower urinary tract symptoms diarrhea or fatigue.  His most recent PSA is stable at less than 0.02.  He does have nocturia x 3-4 although has been taking Flomax  in the morning with and I have suggested going to the evening after dinner as well as drink light cranberry juice.  He is currently on ADT therapy through urology tolerating that well.  COMPLICATIONS OF TREATMENT: none  FOLLOW UP COMPLIANCE: keeps appointments   PHYSICAL EXAM:  BP (!) 140/85   Pulse 64   Temp (!) 96.9 F (36.1 C)   Resp 14   Ht 5\' 4"  (1.626 m)   Wt 131 lb 12.8 oz (59.8 kg)   BMI 22.62 kg/m  Well-developed well-nourished patient in NAD. HEENT reveals PERLA, EOMI, discs not visualized.  Oral cavity is clear. No oral mucosal lesions are identified. Neck is clear without evidence of cervical or supraclavicular adenopathy. Lungs are clear to A&P. Cardiac examination is essentially unremarkable with regular rate and rhythm without murmur rub or thrill. Abdomen is benign with no organomegaly or masses noted. Motor sensory and DTR levels are equal and symmetric in the upper and lower extremities. Cranial nerves II through XII are grossly intact. Proprioception is intact. No peripheral adenopathy or edema is identified. No motor or sensory levels are noted. Crude visual fields are within  normal range.  RADIOLOGY RESULTS: No current films for review  PLAN: Present time patient is doing well now out over 2-1/2 years with excellent biochemical control of his prostate cancer.  He continues ADT treatment through urology.  I am going to turn follow-up care over to them I would be happy to reevaluate the patient anytime should that be indicated.  Patient and family know to call with any concerns.  I would like to take this opportunity to thank you for allowing me to participate in the care of your patient.Glenis Langdon, MD

## 2024-01-27 ENCOUNTER — Other Ambulatory Visit: Payer: Self-pay

## 2024-02-03 ENCOUNTER — Ambulatory Visit: Payer: Self-pay | Admitting: Urology

## 2024-02-03 VITALS — BP 172/73 | HR 75 | Ht 64.0 in | Wt 136.0 lb

## 2024-02-03 DIAGNOSIS — C61 Malignant neoplasm of prostate: Secondary | ICD-10-CM

## 2024-02-03 DIAGNOSIS — N401 Enlarged prostate with lower urinary tract symptoms: Secondary | ICD-10-CM

## 2024-02-03 DIAGNOSIS — R351 Nocturia: Secondary | ICD-10-CM

## 2024-02-03 DIAGNOSIS — N138 Other obstructive and reflux uropathy: Secondary | ICD-10-CM

## 2024-02-03 MED ORDER — TAMSULOSIN HCL 0.4 MG PO CAPS
0.4000 mg | ORAL_CAPSULE | Freq: Every day | ORAL | 3 refills | Status: AC
Start: 2024-02-03 — End: ?

## 2024-02-03 MED ORDER — LEUPROLIDE ACETATE (6 MONTH) 45 MG ~~LOC~~ KIT
45.0000 mg | PACK | Freq: Once | SUBCUTANEOUS | Status: AC
  Administered 2024-02-03: 45 mg via SUBCUTANEOUS

## 2024-02-03 NOTE — Progress Notes (Signed)
   02/03/2024 9:49 AM   Russell Holmes 04-10-1941 [id number]  Reason for visit: Follow up high risk prostate cancer, LUTS   HPI: 83 year old male diagnosed with high risk prostate cancer including 4+5= 9 with 95% max core involvement, 20 g prostate, no evidence of metastatic disease who underwent external beam radiation(completed December 2022) with plan for 2-3 years of ADT total.  Initial dose of ADT given on 07/10/2021.   He did well with radiation.  Urinary symptoms overall are stable, and he has some baseline urgency and frequency, with rare urge incontinence.  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  I counseled him about the relationship between alcohol and OAB symptom, as well as overall health.  His primary urinary symptoms today are frequency and nocturia.  He continues on Flomax , he is unsure if this is helpful.  Nocturia strategies discussed at length.   Most recent PSA 0.02 and stable from prior.   We discussed the importance of monitoring the PSA trend, and continuing 2-3 years ADT.  Final dose of ADT 36-month injection given today, see nursing note.  Will continue to monitor the PSA moving forward.   RTC 6 months PSA prior Flomax  refilled  Lawerence Pressman, MD  [facility] [address], [address] [place], [place] [address] [phone]

## 2024-02-03 NOTE — Progress Notes (Signed)
 Eligard  SubQ Injection   Due to Prostate Cancer patient is present today for a Eligard  Injection.  Medication: Eligard  6 month Dose: 45 mg  Location: Left arm Lot: [id number] Exp: 03/2025  Patient tolerated well, no complications were noted  Performed by: [name]  Per Dr. Estanislao Heimlich patient received his last injection today. Reminder continue on Vitamin D 800-1000iu and Calcium 1000-1200mg  daily while on Androgen Deprivation Therapy.  PA approval dates: 07/19/23 - 07/18/24

## 2024-02-03 NOTE — Patient Instructions (Signed)

## 2024-05-10 ENCOUNTER — Encounter: Payer: Self-pay | Admitting: Urology

## 2024-08-04 ENCOUNTER — Other Ambulatory Visit

## 2024-08-08 ENCOUNTER — Encounter: Payer: Self-pay | Admitting: Urology

## 2024-08-09 ENCOUNTER — Ambulatory Visit: Admitting: Urology

## 2024-08-10 ENCOUNTER — Ambulatory Visit: Admitting: Urology
